# Patient Record
Sex: Male | Born: 1970
Health system: Southern US, Community
[De-identification: ages and names within clinical notes are randomized; demographics above are authoritative.]

## PROBLEM LIST (undated history)

## (undated) DIAGNOSIS — I10 Essential (primary) hypertension: Secondary | ICD-10-CM

## (undated) DIAGNOSIS — F419 Anxiety disorder, unspecified: Secondary | ICD-10-CM

## (undated) DIAGNOSIS — K589 Irritable bowel syndrome without diarrhea: Secondary | ICD-10-CM

## (undated) DIAGNOSIS — K200 Eosinophilic esophagitis: Secondary | ICD-10-CM

## (undated) DIAGNOSIS — K222 Esophageal obstruction: Secondary | ICD-10-CM

## (undated) DIAGNOSIS — K219 Gastro-esophageal reflux disease without esophagitis: Secondary | ICD-10-CM

## (undated) DIAGNOSIS — G43909 Migraine, unspecified, not intractable, without status migrainosus: Secondary | ICD-10-CM

## (undated) DIAGNOSIS — I4891 Unspecified atrial fibrillation: Secondary | ICD-10-CM

## (undated) DIAGNOSIS — T7840XA Allergy, unspecified, initial encounter: Secondary | ICD-10-CM

## (undated) HISTORY — PX: ARTHROSCOPIC REPAIR ACL: SUR80

## (undated) HISTORY — DX: Allergy, unspecified, initial encounter: T78.40XA

## (undated) HISTORY — DX: Anxiety disorder, unspecified: F41.9

## (undated) HISTORY — PX: MYRINGOTOMY: SUR874

## (undated) HISTORY — DX: Eosinophilic esophagitis: K20.0

## (undated) HISTORY — DX: Unspecified atrial fibrillation: I48.91

## (undated) HISTORY — PX: OTHER SURGICAL HISTORY: SHX169

## (undated) HISTORY — DX: Gastro-esophageal reflux disease without esophagitis: K21.9

## (undated) HISTORY — DX: Migraine, unspecified, not intractable, without status migrainosus: G43.909

## (undated) HISTORY — PX: ADENOIDECTOMY: SUR15

## (undated) HISTORY — DX: Irritable bowel syndrome, unspecified: K58.9

## (undated) HISTORY — DX: Esophageal obstruction: K22.2

---

## 2005-04-18 ENCOUNTER — Encounter: Admission: RE | Admit: 2005-04-18 | Discharge: 2005-04-18 | Payer: Self-pay | Admitting: Family Medicine

## 2005-05-09 ENCOUNTER — Ambulatory Visit (HOSPITAL_COMMUNITY): Admission: RE | Admit: 2005-05-09 | Discharge: 2005-05-09 | Payer: Self-pay | Admitting: Neurology

## 2005-07-05 ENCOUNTER — Ambulatory Visit: Payer: Self-pay

## 2010-11-23 ENCOUNTER — Other Ambulatory Visit: Payer: Self-pay | Admitting: Family Medicine

## 2010-11-23 DIAGNOSIS — R202 Paresthesia of skin: Secondary | ICD-10-CM

## 2010-12-02 ENCOUNTER — Other Ambulatory Visit: Payer: Self-pay

## 2010-12-03 ENCOUNTER — Ambulatory Visit
Admission: RE | Admit: 2010-12-03 | Discharge: 2010-12-03 | Disposition: A | Discharge status: Home / Self Care | Payer: BC Managed Care – PPO | Source: Ambulatory Visit | Attending: Family Medicine | Admitting: Family Medicine

## 2010-12-03 DIAGNOSIS — R202 Paresthesia of skin: Secondary | ICD-10-CM

## 2010-12-03 MED ORDER — GADOBENATE DIMEGLUMINE 529 MG/ML IV SOLN
15.0000 mL | Freq: Once | INTRAVENOUS | Status: AC | PRN
  Administered 2010-12-03: 15 mL via INTRAVENOUS

## 2011-04-23 ENCOUNTER — Emergency Department (HOSPITAL_COMMUNITY): Payer: BC Managed Care – PPO

## 2011-04-23 ENCOUNTER — Emergency Department (HOSPITAL_COMMUNITY)
Admission: EM | Admit: 2011-04-23 | Discharge: 2011-04-23 | Disposition: A | Discharge status: Home / Self Care | Payer: BC Managed Care – PPO | Attending: Emergency Medicine | Admitting: Emergency Medicine

## 2011-04-23 ENCOUNTER — Encounter (HOSPITAL_COMMUNITY): Payer: Self-pay | Admitting: Emergency Medicine

## 2011-04-23 ENCOUNTER — Other Ambulatory Visit: Payer: Self-pay

## 2011-04-23 DIAGNOSIS — I498 Other specified cardiac arrhythmias: Secondary | ICD-10-CM | POA: Insufficient documentation

## 2011-04-23 DIAGNOSIS — R0602 Shortness of breath: Secondary | ICD-10-CM | POA: Insufficient documentation

## 2011-04-23 DIAGNOSIS — R079 Chest pain, unspecified: Secondary | ICD-10-CM | POA: Insufficient documentation

## 2011-04-23 DIAGNOSIS — R0789 Other chest pain: Secondary | ICD-10-CM | POA: Insufficient documentation

## 2011-04-23 DIAGNOSIS — I10 Essential (primary) hypertension: Secondary | ICD-10-CM | POA: Insufficient documentation

## 2011-04-23 DIAGNOSIS — R002 Palpitations: Secondary | ICD-10-CM | POA: Insufficient documentation

## 2011-04-23 HISTORY — DX: Essential (primary) hypertension: I10

## 2011-04-23 LAB — CBC
HCT: 46.8 % (ref 39.0–52.0)
Hemoglobin: 17 g/dL (ref 13.0–17.0)
MCHC: 36.3 g/dL — ABNORMAL HIGH (ref 30.0–36.0)
Platelets: 283 10*3/uL (ref 150–400)
RBC: 5.24 MIL/uL (ref 4.22–5.81)

## 2011-04-23 LAB — DIFFERENTIAL
Basophils Relative: 0 % (ref 0–1)
Eosinophils Absolute: 0.1 10*3/uL (ref 0.0–0.7)
Eosinophils Relative: 1 % (ref 0–5)
Lymphocytes Relative: 34 % (ref 12–46)
Neutro Abs: 5.7 10*3/uL (ref 1.7–7.7)

## 2011-04-23 LAB — BASIC METABOLIC PANEL
CO2: 24 mEq/L (ref 19–32)
Chloride: 99 mEq/L (ref 96–112)
Creatinine, Ser: 0.94 mg/dL (ref 0.50–1.35)
GFR calc non Af Amer: >90 mL/min (ref >90)
Glucose, Bld: 112 mg/dL — ABNORMAL HIGH (ref 70–99)

## 2011-04-23 LAB — POCT I-STAT TROPONIN I

## 2011-04-23 LAB — D-DIMER, QUANTITATIVE: D-Dimer, Quant: <0.22 ug/mL-FEU (ref 0.00–0.48)

## 2011-04-23 MED ORDER — LORAZEPAM 2 MG/ML IJ SOLN
0.5000 mg | Freq: Once | INTRAMUSCULAR | Status: AC
  Administered 2011-04-23: 0.5 mg via INTRAVENOUS
  Filled 2011-04-23: qty 1

## 2011-04-23 MED ORDER — SODIUM CHLORIDE 0.9 % IV BOLUS (SEPSIS)
1000.0000 mL | Freq: Once | INTRAVENOUS | Status: AC
  Administered 2011-04-23: 1000 mL via INTRAVENOUS

## 2011-04-23 MED ORDER — ALPRAZOLAM 0.5 MG PO TABS
0.5000 mg | ORAL_TABLET | Freq: Every evening | ORAL | Status: AC | PRN
Start: 2011-04-23 — End: 2011-05-23

## 2011-04-23 NOTE — ED Notes (Signed)
Pt reports sudden onset of weakness, set down, became pale, sob w/talking, and chest "fluttering" approx 1100 this am, Pt ran 2 miles and a 10 minute sprint workout approx 0800-0830 this am. Pt dx w/HTN and started on Losartan 25 mg BID in February for 3 weeks, pcp then increased Losartan to 50 mg BID. Pt had a similar episode in January. Pt does not have a Cardiologist. Pt denies chest/abd/back pain, N/V/D, dizziness, or diaphoresis. Pt reports URI infection and nasal congestion 3 weeks ago, reports taking Sudafed and Delsym for cough and congestion.

## 2011-04-23 NOTE — ED Provider Notes (Signed)
Medical screening examination/treatment/procedure(s) were conducted as a shared visit with non-physician practitioner(s) and myself.  I personally evaluated the patient during the encounter   Forbes Cellar, MD 04/23/11 2057

## 2011-04-23 NOTE — ED Notes (Signed)
Medical screening examination/treatment/procedure(s) were conducted as a shared visit with non-physician practitioner(s) and myself.  I personally evaluated the patient during the encounter   Forbes Cellar, MD 04/23/11 [phone]

## 2011-04-23 NOTE — ED Notes (Signed)
Pt. Stated, i started having some irregular heartbeat about 45 minutes ago.  When i start talking i get out of beath

## 2011-04-23 NOTE — ED Provider Notes (Signed)
Patient had two negative troponins and repeat EKG is unchanged.  VSS.  Pt d/c'd home w/ short course of xanax for anxiety, as recommended by Dr. Hyman Hopes.  He will follow up with this PCP as well as [facility].  He has been advised to avoid exertional activities until evaluated by the cardiologist.  Return precautions discussed.   Otilio Miu, [place] 04/23/11 [phone]

## 2011-04-23 NOTE — ED Provider Notes (Signed)
History     CSN: [id number]  Arrival date & time 04/23/11  1148   First MD Initiated Contact with Patient 04/23/11 1218     HPI Patient reports he is playing outside with his children this afternoon. Dates when he went back inside his heart suddenly began to flutter and beat her regularly. States this has been constant since it began. Associated with some mild shortness of breath that occurs when he is talking. Denies any pain. Denies particularly any anxiety, back pain. Reports significant history of hypertension and  family history of his father having a cardiomyopathy but not ACS. Patient is a 41 y.o. male presenting with palpitations. The history is provided by the patient and the spouse.  Palpitations  This is a new problem. The current episode started less than 1 hour ago. Episode frequency: Every few minutes. The problem has been gradually improving. The problem is associated with an unknown factor. Associated symptoms include shortness of breath. Pertinent negatives include no fever, no malaise/fatigue, no numbness, no chest pain, no syncope, no abdominal pain, no nausea, no vomiting, no headaches, no back pain, no leg pain, no dizziness and no weakness. He has tried breathing exercises for the symptoms. The treatment provided mild relief. Risk factors include being male.    Past Medical History  Diagnosis Date  . Hypertension     History reviewed. No pertinent past surgical history.  No family history on file.  History  Substance Use Topics  . Smoking status: Not on file  . Smokeless tobacco: Not on file  . Alcohol Use: 1.2 oz/week    2 Glasses of wine per week      Review of Systems  Constitutional: Negative for fever, chills and malaise/fatigue.  HENT: Negative for sore throat, mouth sores, neck pain and sinus pressure.   Respiratory: Positive for shortness of breath.   Cardiovascular: Positive for palpitations. Negative for chest pain and syncope.  Gastrointestinal:  Negative for nausea, vomiting and abdominal pain.  Musculoskeletal: Negative for back pain.  Neurological: Negative for dizziness, syncope, speech difficulty, weakness, light-headedness, numbness and headaches.  All other systems reviewed and are negative.    Allergies  Penicillins  Home Medications   Current Outpatient Rx  Name Route Sig Dispense Refill  . VITAMIN C PO Oral Take 1 tablet by mouth daily.    Marland Kitchen LOSARTAN POTASSIUM 25 MG PO TABS Oral Take 25 mg by mouth 2 (two) times daily.    . ADULT MULTIVITAMIN W/MINERALS CH Oral Take 1 tablet by mouth daily.    Marland Kitchen RANITIDINE HCL 150 MG PO TABS Oral Take 150 mg by mouth daily.      BP 139/82  Pulse 127  Temp(Src) 98.1 F (36.7 C) (Oral)  Resp 20  SpO2 99%  Physical Exam  Constitutional: He is oriented to person, place, and time. He appears well-developed and well-nourished.  HENT:  Head: Normocephalic and atraumatic.  Eyes: Conjunctivae are normal. Pupils are equal, round, and reactive to light.  Neck: Normal range of motion. Neck supple.  Cardiovascular: Regular rhythm, normal heart sounds and intact distal pulses.  Tachycardia present.  Exam reveals no gallop and no friction rub.   No murmur heard. Pulmonary/Chest: Effort normal and breath sounds normal. He has no wheezes. He has no rales. He exhibits no tenderness.  Abdominal: Soft. Bowel sounds are normal. He exhibits no distension and no mass. There is no tenderness. There is no rebound and no guarding.  Neurological: He is alert and oriented  to person, place, and time.  Skin: Skin is warm and dry. No rash noted. No erythema. No pallor.  Psychiatric: He has a normal mood and affect. His behavior is normal.    ED Course  Procedures  Results for orders placed during the hospital encounter of 04/23/11  CBC      Component Value Range   WBC 9.8  4.0 - 10.5 (K/uL)   RBC 5.24  4.22 - 5.81 (MIL/uL)   Hemoglobin 17.0  13.0 - 17.0 (g/dL)   HCT 40.9  81.1 - 91.4 (%)   MCV  89.3  78.0 - 100.0 (fL)   MCH 32.4  26.0 - 34.0 (pg)   MCHC 36.3 (*) 30.0 - 36.0 (g/dL)   RDW 78.2  95.6 - 21.3 (%)   Platelets 283  150 - 400 (K/uL)  DIFFERENTIAL      Component Value Range   Neutrophils Relative 58  43 - 77 (%)   Neutro Abs 5.7  1.7 - 7.7 (K/uL)   Lymphocytes Relative 34  12 - 46 (%)   Lymphs Abs 3.3  0.7 - 4.0 (K/uL)   Monocytes Relative 7  3 - 12 (%)   Monocytes Absolute 0.7  0.1 - 1.0 (K/uL)   Eosinophils Relative 1  0 - 5 (%)   Eosinophils Absolute 0.1  0.0 - 0.7 (K/uL)   Basophils Relative 0  0 - 1 (%)   Basophils Absolute 0.0  0.0 - 0.1 (K/uL)  BASIC METABOLIC PANEL      Component Value Range   Sodium 135  135 - 145 (mEq/L)   Potassium 3.7  3.5 - 5.1 (mEq/L)   Chloride 99  96 - 112 (mEq/L)   CO2 24  19 - 32 (mEq/L)   Glucose, Bld 112 (*) 70 - 99 (mg/dL)   BUN 11  6 - 23 (mg/dL)   Creatinine, Ser 0.86  0.50 - 1.35 (mg/dL)   Calcium 9.4  8.4 - 57.8 (mg/dL)   GFR calc non Af Amer >90  >90 (mL/min)   GFR calc Af Amer >90  >90 (mL/min)  TROPONIN I      Component Value Range   Troponin I <0.30  <0.30 (ng/mL)   Dg Chest 2 View  04/23/2011  *RADIOLOGY REPORT*  Clinical Data: Palpitations.  Chest tightness.  Shortness of breath.  Cough.  Hypertension.  CHEST - 2 VIEW  Comparison: None.  Findings: Midline trachea.  Normal heart size and mediastinal contours. No pleural effusion or pneumothorax.  Clear lungs.  There may be biapical pleural thickening.  IMPRESSION: No acute cardiopulmonary disease.  Original Report Authenticated By: Consuello Bossier, M.D.   ED ECG REPORT   Date: 04/23/2011  EKG Time: 1:55 PM  Rate: 113  Rhythm: sinus tachycardia,  there are no previous tracings available for comparison  Axis: normal  Intervals:none  ST&T Change: none   MDM   1:54 PM  Discussed imaging and labs with patient and family. Patient's heart rate has come down from 133 when I initially entered to 111. Will give another liter of fluids in 0.5 mg of Ativan. Pending  d-dimer and troponin 3 hours. Discussed patient with Betsey Holiday PA-C in the CDU. She will continue care as the patient. Discussed with patient and family the patient followup with cardiologist for possible Holter monitor placement. On room with patient I have seen several skipped beats and PVCs but not at regular intervals. Patient and family voiced understanding. Patient should have another EKG in the  CDU prior to discharge and with heart rate normal.      Thomasene Lot, PA-C 04/23/11 1437

## 2011-04-23 NOTE — ED Provider Notes (Addendum)
Medical screening examination/treatment/procedure(s) were conducted as a shared visit with non-physician practitioner(s) and myself.  I personally evaluated the patient during the encounter  H/o HTN pw Palpitations after exercise with cp or SOB. H/O similar after exercise on a bike. Denies h/o VTE in self or family. No recent hosp/surg/immob. No h/o cancer. Denies exogenous hormone use, no leg pain or swelling. Sinus tachycardia on initial ekg. Does admit to social stressors but doesn't feel "overly anxious". Labs unremarkable including d dimer, trop x 2. After IVF, ativan HR < 100. Asking for anxiolytics for home until he can f/u with PMD and cardiology referral. Repeat EKG   Date: 04/23/2011  Rate: 89  Rhythm: normal sinus rhythm  QRS Axis: normal  Intervals: normal  ST/T Wave abnormalities: normal  Conduction Disutrbances:none  Narrative Interpretation:   Old EKG Reviewed: changes noted rate decreased from previous     Forbes Cellar, MD 04/23/11 1631  Forbes Cellar, MD 04/23/11 2056

## 2011-04-23 NOTE — ED Notes (Signed)
Pt is currently comfortable with no palpitations, heart flutter, and chest pain.  Plan to repeat cardiac markers and ekg.  If normal, plan to f/u with cardiologist.  Pt doesn't currently have a cardiologist.  Will give referral upon discharge.  Due to anxiety, pt would like to have anti-anxiety medication upon discharge.  Advised no exercise until f/u with cardiologist due to pt's family history of cardiomyopathy.  Pt and family voiced understanding.  Jim Gonzalez, [place] 04/23/11 [phone]

## 2011-04-23 NOTE — Discharge Instructions (Signed)
Take xanax as needed for anxiety.  If you need more of this medication, follow up with your primary care doctor.  Follow up with the cardiologist for further evaluation of your chest pain and palpitations.  You should avoid all exertional activities until you have seen the cardiologist.  You may return to the ER if your symptoms worsen or you have any other concerns.

## 2011-04-23 NOTE — ED Notes (Signed)
Report received from Victorino Dike, RN. Pt coming over to cdu to wait for another troponin.

## 2011-04-23 NOTE — ED Notes (Signed)
Lab tech here to draw 1500 troponin level.

## 2011-04-23 NOTE — ED Notes (Signed)
Patient denies pain and is resting comfortably.  

## 2011-04-27 ENCOUNTER — Encounter: Payer: Self-pay | Admitting: Cardiology

## 2011-05-03 ENCOUNTER — Encounter: Payer: Self-pay | Admitting: *Deleted

## 2011-05-03 ENCOUNTER — Encounter: Payer: Self-pay | Admitting: Cardiology

## 2011-05-04 ENCOUNTER — Ambulatory Visit (INDEPENDENT_AMBULATORY_CARE_PROVIDER_SITE_OTHER): Payer: BC Managed Care – PPO | Admitting: Cardiology

## 2011-05-04 ENCOUNTER — Encounter: Payer: Self-pay | Admitting: Cardiology

## 2011-05-04 VITALS — BP 140/90 | HR 92 | Ht 70.0 in | Wt 181.1 lb

## 2011-05-04 DIAGNOSIS — I4891 Unspecified atrial fibrillation: Secondary | ICD-10-CM

## 2011-05-04 DIAGNOSIS — R002 Palpitations: Secondary | ICD-10-CM

## 2011-05-04 DIAGNOSIS — I10 Essential (primary) hypertension: Secondary | ICD-10-CM

## 2011-05-04 MED ORDER — METOPROLOL SUCCINATE ER 25 MG PO TB24: 25.0000 mg | ORAL_TABLET | Freq: Every day | ORAL | Status: DC

## 2011-05-04 NOTE — Assessment & Plan Note (Signed)
Given his atrial fibrillation I will discontinue his Cozaar and instead use Toprol both for his blood pressure and for rate control if his atrial fibrillation recurs.

## 2011-05-04 NOTE — Assessment & Plan Note (Signed)
Patient presents for evaluation of atrial fibrillation. He had a documented episode on April 20 that resolved spontaneously. He also had a probable short episode while exercising in January. An echocardiogram shows normal LV function. Outside TSH reportedly normal. I will discontinue his Cozaar. I will add Toprol 25 mg daily both to control blood pressure and for rate control if his atrial fibrillation recurs. He has been given a prescription for flecainide as needed which he will continue. If he has more frequent episodes in the future then we will need to consider a daily antiarrhythmic. Patient has embolic risk factors of hypertension. Based on recent Chest guidelines, pradaxa would most likely be the best choice for anti-coagulation. I will obtain records from his cardiologist at [facility] concerning their evaluation before making that decision. Continue aspirin for now. Note patient does not need to cardiologist. He will discuss with his wife whether he would like to followup at [facility] or with me. He will contact us with that decision soon.

## 2011-05-04 NOTE — Patient Instructions (Signed)
Your physician wants you to follow-up in: 6 MONTHS WITH DR Jens Som You will receive a reminder letter in the mail two months in advance. If you don't receive a letter, please call our office to schedule the follow-up appointment.   STOP LOSARTAN  START METOPROLOL SUCC 25 MG ONCE DAILY

## 2011-05-04 NOTE — Progress Notes (Signed)
HPI: A pleasant 41 year old male for evaluation of atrial fibrillation. The patient denies dyspnea on exertion, orthopnea, PND, pedal edema, syncope or exertional chest pain. In January while exercising he noticed onset of palpitations described as his heart racing and fluttering. He stopped exercising and his symptoms resolved after 5 minutes. On April 20 the patient was outside playing with his children. He went inside and as he was drinking a cold drink he developed sudden onset of palpitations. No associated chest pain, dyspnea or syncope. He ultimately went to the emergency room. His electrocardiogram revealed atrial fibrillation with a rapid ventricular response. He converted spontaneously to sinus rhythm. Hemoglobin was 17. Potassium was 3.7 with normal renal function. Troponin negative. Chest x-ray showed no acute cardiopulmonary disease. D-dimer was negative. Patient was seen at [facility] for his atrial fibrillation. An echocardiogram showed normal LV function. The right ventricle was borderline dilated. There was mild mitral regurgitation. The patient was given a prescription for flecainide to be used as needed. Apparently he had a TSH checked by his primary care physician which was normal. He has had no problems since. Because of the above he presented for evaluation.  Current Outpatient Prescriptions  Medication Sig Dispense Refill  . ALPRAZolam (XANAX) 0.5 MG tablet Take 1 tablet (0.5 mg total) by mouth at bedtime as needed for sleep.  5 tablet  0  . aspirin 81 MG tablet Take 81 mg by mouth 2 (two) times daily.      . Multiple Vitamin (MULITIVITAMIN WITH MINERALS) TABS Take 1 tablet by mouth daily.      Marland Kitchen omeprazole (PRILOSEC) 20 MG capsule Take 20 mg by mouth daily.      . sertraline (ZOLOFT) 25 MG tablet Take 25 mg by mouth daily.      . metoprolol succinate (TOPROL XL) 25 MG 24 hr tablet Take 1 tablet (25 mg total) by mouth daily.  30 tablet  11    Allergies  Allergen Reactions    . Penicillins Hives    Past Medical History  Diagnosis Date  . Hypertension   . Atrial fibrillation   . Anxiety   . GERD (gastroesophageal reflux disease)     Past Surgical History  Procedure Date  . Left knee surgery     ACL    History   Social History  . Marital Status: Married    Spouse Name: N/A    Number of Children: 2  . Years of Education: N/A   Occupational History  .      [employer]   Social History Main Topics  . Smoking status: Never Smoker   . Smokeless tobacco: Not on file  . Alcohol Use: 1.2 oz/week    2 Glasses of wine per week     Glass of wine every other night  . Drug Use: No  . Sexually Active: Not on file   Other Topics Concern  . Not on file   Social History Narrative  . No narrative on file    Family History  Problem Relation Age of Onset  . Heart disease Father     Cardiomyopathy    ROS: no fevers or chills, productive cough, hemoptysis, dysphasia, odynophagia, melena, hematochezia, dysuria, hematuria, rash, seizure activity, orthopnea, PND, pedal edema, claudication. Remaining systems are negative.  Physical Exam:   Blood pressure 140/90, pulse 92, height 5\' 10"  (1.778 m), weight 82.137 kg (181 lb 1.3 oz).  General:  Well developed/well nourished in NAD Skin warm/dry Patient not depressed No peripheral  clubbing Back-normal HEENT-normal/normal eyelids Neck supple/normal carotid upstroke bilaterally; no bruits; no JVD; no thyromegaly chest - CTA/ normal expansion CV - RRR/normal S1 and S2; no rubs or gallops;  PMI nondisplaced, 1/6 systolic ejection murmur.  Abdomen -NT/ND, no HSM, no mass, + bowel sounds, no bruit 2+ femoral pulses, no bruits Ext-no edema, chords, 2+ DP Neuro-grossly nonfocal  ECG normal sinus rhythm at a rate of 70. No ST changes.

## 2011-05-05 ENCOUNTER — Telehealth: Payer: Self-pay | Admitting: Cardiology

## 2011-05-05 NOTE — Telephone Encounter (Signed)
i have no knowledge of medical facilities in [place] [address]

## 2011-05-05 NOTE — Telephone Encounter (Signed)
Will forward for dr crenshaw review  

## 2011-05-05 NOTE — Telephone Encounter (Signed)
Please return call to patient who was seen 05/03/28. He can be reached at [phone].  Patient questions are:   Patient planning trip to [place], and would like to know if there is a particular medical facility there available to assist him with his A-Fib in the event it becomes an issue.  If Dr. Jens Som does not think there is adequate medical resources there patient will be switching trip to [place]. Please respond ASAP to allow patient to complete travel plans.  Thank You   Meka

## 2011-05-06 NOTE — Telephone Encounter (Signed)
Spoke with pt, aware of dr Ludwig Clarks remarks.

## 2011-05-12 ENCOUNTER — Telehealth: Payer: Self-pay | Admitting: Physician Assistant

## 2011-05-12 NOTE — Telephone Encounter (Signed)
Patient called re: dullness in chest and shortness of breath. He has a history of a fib and HTN. No underlying COPD or asthma. States he does not experience chest pain with a fib. Has not had an exacerbation. Has noticed this over the past week. Was recently started on Toprol and Zoloft. Upon further description, he states this has occurred for the past week, intermittent, occurring mostly at rest, and chest pain is more epigastric/abdominal. States it feels like prior episodes of "nervousness." Advised that it is likely anxiety related. His symptoms are more consistent with this and he has minimal ischemic risk factors. Advised if it worsens to call the office. Understood and agreed.   Jacqulyn Bath, PA-C 05/12/2011 5:28 PM

## 2011-05-13 ENCOUNTER — Telehealth: Payer: Self-pay | Admitting: Cardiology

## 2011-05-13 MED ORDER — DILTIAZEM HCL ER COATED BEADS 120 MG PO CP24: 120.0000 mg | ORAL_CAPSULE | Freq: Every day | ORAL | Status: DC

## 2011-05-13 NOTE — Telephone Encounter (Signed)
Spoke with pt, he is taking the metoprolol with food and he has been having a pain in the upper part of his stomach and then occ nausea. This has been going on since starting the med. He has not taken it today and has had no symptoms. He would like to try something different if possible. Will forward for dr Jens Som review

## 2011-05-13 NOTE — Telephone Encounter (Signed)
Dc toprol; cardizem CD 120 mg po daily Olga Millers

## 2011-05-13 NOTE — Telephone Encounter (Signed)
New msg Pt said he started metoprolol and he has been having nausea. He wanted to talk to you

## 2011-05-13 NOTE — Telephone Encounter (Signed)
Spoke with pt, Aware of dr crenshaw's recommendations.  °

## 2011-05-17 ENCOUNTER — Institutional Professional Consult (permissible substitution): Payer: BC Managed Care – PPO | Admitting: Cardiology

## 2011-05-24 ENCOUNTER — Telehealth: Payer: Self-pay | Admitting: Cardiology

## 2011-05-24 NOTE — Telephone Encounter (Signed)
New msg Pt said he started on diltiazem and he said he feels its not working. He said this is second med he has tried. The first was metoprolol. He maybe wants to go back to metoprolol.

## 2011-05-24 NOTE — Telephone Encounter (Signed)
Spoke with pt, he has been on diltiazem 120 mg for about 10-12 days now. He has noticed his bp is not coming down as good as with metoprolol. His bp is running 130/90ish. He is having no palpitations. He thinks all of his previous issues were caused by prilosec. He has stopped the prilosce and has had no further issues. He wants to try the metoprolol again because it brought his bp down better. He will try 12.5 mg and see if he is able to tolerate. If not he will call me back so the diltiazem dose can be adjusted.

## 2011-06-22 ENCOUNTER — Telehealth: Payer: Self-pay | Admitting: Cardiology

## 2011-06-22 NOTE — Telephone Encounter (Signed)
Spoke with pt, records from [facility] reviewed by dr Jens Som, pt called and follow up appt made to discuss atrial fib and anticoagulation.

## 2011-06-22 NOTE — Telephone Encounter (Signed)
Follow up on previous call:  Patient returning call back to [facility]

## 2011-06-29 ENCOUNTER — Encounter: Payer: Self-pay | Admitting: Cardiology

## 2011-06-29 ENCOUNTER — Ambulatory Visit (INDEPENDENT_AMBULATORY_CARE_PROVIDER_SITE_OTHER): Payer: BC Managed Care – PPO | Admitting: Cardiology

## 2011-06-29 VITALS — BP 142/92 | HR 82 | Ht 70.0 in | Wt 187.1 lb

## 2011-06-29 DIAGNOSIS — I10 Essential (primary) hypertension: Secondary | ICD-10-CM

## 2011-06-29 DIAGNOSIS — I4891 Unspecified atrial fibrillation: Secondary | ICD-10-CM

## 2011-06-29 MED ORDER — DILTIAZEM HCL ER COATED BEADS 240 MG PO CP24: 240.0000 mg | ORAL_CAPSULE | Freq: Every day | ORAL | Status: DC

## 2011-06-29 NOTE — Patient Instructions (Addendum)
Your physician wants you to follow-up in: 6 MONTHS WITH DR Jens Som IN [place] You will receive a reminder letter in the mail two months in advance. If you don't receive a letter, please call our office to schedule the follow-up appointment.   INCREASE DILTIAZEM TO 240 MG ONCE DAILY

## 2011-06-29 NOTE — Progress Notes (Signed)
   HPI: Pleasant male for fu of atrial fibrillation. Patient had probable atrial fibrillation in January of 2013 for approximately 5 minutes. Recurrent episode in April 2013 requiring visit to ER. Hemoglobin was 17. Potassium was 3.7 with normal renal function. Troponin negative. Chest x-ray showed no acute cardiopulmonary disease. D-dimer was negative. Patient was seen at [facility] for his atrial fibrillation. An echocardiogram showed normal LV function. The right ventricle was borderline dilated. There was mild mitral regurgitation. The patient was given a prescription for flecainide to be used as needed. Apparently he had a TSH checked by his primary care physician which was normal. I last saw him in May of 2013. We changed his cozaar to toprol. Since then, he occasionally feels a sensation of dyspnea but does not have dyspnea with running. There is no orthopnea, PND, pedal edema, syncope or exertional chest pain. He's had no recurrent bouts of atrial fibrillation.    Current Outpatient Prescriptions  Medication Sig Dispense Refill  . ALPRAZolam (XANAX) 0.5 MG tablet Take 0.5 mg by mouth as needed.       Marland Kitchen aspirin 81 MG tablet Take 81 mg by mouth 2 (two) times daily.      Marland Kitchen diltiazem (CARDIZEM CD) 120 MG 24 hr capsule Take 1 capsule (120 mg total) by mouth daily.  30 capsule  12  . Multiple Vitamin (MULITIVITAMIN WITH MINERALS) TABS Take 1 tablet by mouth daily.         Past Medical History  Diagnosis Date  . Hypertension   . Atrial fibrillation   . Anxiety   . GERD (gastroesophageal reflux disease)     Past Surgical History  Procedure Date  . Left knee surgery     ACL    History   Social History  . Marital Status: Married    Spouse Name: N/A    Number of Children: 2  . Years of Education: N/A   Occupational History  .      [employer]   Social History Main Topics  . Smoking status: Never Smoker   . Smokeless tobacco: Not on file  . Alcohol Use: 1.2 oz/week    2 Glasses  of wine per week     Glass of wine every other night  . Drug Use: No  . Sexually Active: Not on file   Other Topics Concern  . Not on file   Social History Narrative  . No narrative on file    ROS: no fevers or chills, productive cough, hemoptysis, dysphasia, odynophagia, melena, hematochezia, dysuria, hematuria, rash, seizure activity, orthopnea, PND, pedal edema, claudication. Remaining systems are negative.  Physical Exam: Well-developed well-nourished in no acute distress.  Skin is warm and dry.  HEENT is normal.  Neck is supple.  Chest is clear to auscultation with normal expansion.  Cardiovascular exam is regular rate and rhythm.  Abdominal exam nontender or distended. No masses palpated. Extremities show no edema. neuro grossly intact  ECG sinus rhythm at a rate of 82. No ST changes.

## 2011-06-29 NOTE — Assessment & Plan Note (Signed)
Patient remains in sinus rhythm. He did not tolerate Toprol as it caused increased dyspnea. We will continue with Cardizem. I had a long discussion today concerning anti-coagulation. His CHADS score is 1 for hypertension. Previous recommendation would have been either aspirin or Coumadin. However based on the most recent CHEST guidelines a new agent is recommended such as pradaxa over aspirin. I discussed this with him today and he would prefer not to be on a stronger anticoagulant. He prefers to continue with aspirin. He is concerned about the risk of bleeding. He understands the mildly elevated risk of CVA with aspirin compared to the newer agents. If he has more frequent episodes of atrial fibrillation in the future we will consider adding a daily antiarrhythmic. For now he will continue with flecainide when necessary.

## 2011-06-29 NOTE — Assessment & Plan Note (Signed)
Blood pressure elevated. Increase Cardizem to 240 mg by mouth daily.

## 2011-08-15 ENCOUNTER — Other Ambulatory Visit: Payer: Self-pay | Admitting: Cardiology

## 2011-08-15 DIAGNOSIS — I10 Essential (primary) hypertension: Secondary | ICD-10-CM

## 2011-08-15 DIAGNOSIS — I4891 Unspecified atrial fibrillation: Secondary | ICD-10-CM

## 2011-08-15 MED ORDER — DILTIAZEM HCL ER COATED BEADS 120 MG PO CP24: 120.0000 mg | ORAL_CAPSULE | Freq: Every day | ORAL | Status: DC

## 2011-08-15 NOTE — Telephone Encounter (Signed)
New msg Pt is taking diltiazem. He has some sob since taking this med. Please call

## 2011-08-15 NOTE — Telephone Encounter (Signed)
Pt calling stating he feels he is experiencing a feeling like he "cannot catch my breath" since increasing dose of cardizem to 240mg  --states this has happened before with metoprolol and with increased dose of cardiazem --advised to go back to 120mg  cardiazem qd--try for 1 week--we will call you back to see how you are doing-- or if feeling of dyspnea comes back sooner , please call us back or go to nearest ED--[facility]--NT

## 2011-08-24 ENCOUNTER — Telehealth: Payer: Self-pay | Admitting: Cardiology

## 2011-08-24 NOTE — Telephone Encounter (Signed)
Fu call °Pt returning your call  °

## 2011-08-24 NOTE — Telephone Encounter (Signed)
Spoke with pt, he had some trouble with the cardizem 240 mg making him feel it is hard for him to get a deep breath. Since the decrease back to the 120 mg he wakes in the morning fine and then he reports that during the day he will get the feeling that he needs to take a deep breath and can not get a good one. It comes and goes at different times of the day. He does not have trouble sleeping. His bp at his wellness check at work last Friday was 118/60. He has a bp cuff and will cont to monitor his bp. His symptoms are better on the decreased dosage of cardizem but he is not at 100 %. Will forward for dr Jens Som review

## 2011-08-25 NOTE — Telephone Encounter (Signed)
Left message for pt of dr crenshaw's recommendations  

## 2011-08-25 NOTE — Telephone Encounter (Signed)
Continue cardizem at 120 mg daily Jim Gonzalez

## 2011-09-02 ENCOUNTER — Telehealth: Payer: Self-pay | Admitting: Cardiology

## 2011-09-02 NOTE — Telephone Encounter (Signed)
DC cardizem Jim Gonzalez

## 2011-09-02 NOTE — Telephone Encounter (Signed)
Pt has a question regarding his medication °

## 2011-09-02 NOTE — Telephone Encounter (Signed)
Patient was taken Cardizem 240 mg, dose was decrease to 120 mg due to C/O of SOB. Patient states has been taken  120 mg Cardizem for 3 weeks , and  he still has  SOB, its not bad, but he feels like he does not get enough air in his lungs, and can't take deep breaths. Also pt c/o of pain on center of back,  he think it could be  muscle pain. Patient would like to know if it would be okay to stop this medication completely, or he needs to see his PCP to see what else is going on with him.

## 2011-09-06 NOTE — Telephone Encounter (Signed)
Spoke with pt, Aware of dr crenshaw's recommendations.  °

## 2011-10-13 ENCOUNTER — Other Ambulatory Visit: Payer: Self-pay | Admitting: Cardiology

## 2011-10-13 ENCOUNTER — Other Ambulatory Visit: Payer: Self-pay | Admitting: *Deleted

## 2011-10-13 MED ORDER — DILTIAZEM HCL ER COATED BEADS 240 MG PO CP24: 240.0000 mg | ORAL_CAPSULE | Freq: Every day | ORAL | Status: DC

## 2012-01-10 DIAGNOSIS — K219 Gastro-esophageal reflux disease without esophagitis: Secondary | ICD-10-CM | POA: Insufficient documentation

## 2012-01-10 DIAGNOSIS — F419 Anxiety disorder, unspecified: Secondary | ICD-10-CM | POA: Insufficient documentation

## 2012-01-11 ENCOUNTER — Encounter: Payer: Self-pay | Admitting: Cardiology

## 2012-01-11 ENCOUNTER — Ambulatory Visit (INDEPENDENT_AMBULATORY_CARE_PROVIDER_SITE_OTHER): Payer: BC Managed Care – PPO | Admitting: Cardiology

## 2012-01-11 VITALS — BP 142/91 | HR 59 | Wt 185.0 lb

## 2012-01-11 DIAGNOSIS — K219 Gastro-esophageal reflux disease without esophagitis: Secondary | ICD-10-CM

## 2012-01-11 DIAGNOSIS — I10 Essential (primary) hypertension: Secondary | ICD-10-CM

## 2012-01-11 DIAGNOSIS — I4891 Unspecified atrial fibrillation: Secondary | ICD-10-CM

## 2012-01-11 NOTE — Assessment & Plan Note (Signed)
Continue Cardizem. 

## 2012-01-11 NOTE — Progress Notes (Signed)
   HPI: Pleasant male for fu of atrial fibrillation. Patient had probable atrial fibrillation in January of 2013 for approximately 5 minutes. Recurrent episode in April 2013 requiring visit to ER. Hemoglobin was 17. Potassium was 3.7 with normal renal function. Troponin negative. Chest x-ray showed no acute cardiopulmonary disease. D-dimer was negative. Patient was seen at [facility] for his atrial fibrillation. An echocardiogram showed normal LV function. The right ventricle was borderline dilated. There was mild mitral regurgitation. The patient was given a prescription for flecainide to be used as needed. Apparently he had a TSH checked by his primary care physician which was normal. Did not tolerate toprol previously; preferred ASA to NOAC. I last saw him in June of 2013. Since then, there is no dyspnea on exertion, orthopnea, PND, pedal edema, syncope or exertional chest pain. He has brief palpitations twice since I saw him previously but no atrial fibrillation.  Current Outpatient Prescriptions  Medication Sig Dispense Refill  . ALPRAZolam (XANAX) 0.5 MG tablet Take 0.5 mg by mouth as needed.       Marland Kitchen aspirin 81 MG tablet Take 81 mg by mouth 2 (two) times daily.      Marland Kitchen diltiazem (CARDIZEM CD) 240 MG 24 hr capsule Take 1 capsule (240 mg total) by mouth daily.  30 capsule  12  . Multiple Vitamin (MULITIVITAMIN WITH MINERALS) TABS Take 1 tablet by mouth daily.         Past Medical History  Diagnosis Date  . Hypertension   . Atrial fibrillation   . Anxiety   . GERD (gastroesophageal reflux disease)     Past Surgical History  Procedure Date  . Left knee surgery     ACL    History   Social History  . Marital Status: Married    Spouse Name: N/A    Number of Children: 2  . Years of Education: N/A   Occupational History  .      [employer]   Social History Main Topics  . Smoking status: Never Smoker   . Smokeless tobacco: Not on file  . Alcohol Use: 1.2 oz/week    2 Glasses of  wine per week     Comment: Glass of wine every other night  . Drug Use: No  . Sexually Active: Not on file   Other Topics Concern  . Not on file   Social History Narrative  . No narrative on file    ROS: no fevers or chills, productive cough, hemoptysis, dysphasia, odynophagia, melena, hematochezia, dysuria, hematuria, rash, seizure activity, orthopnea, PND, pedal edema, claudication. Remaining systems are negative.  Physical Exam: Well-developed well-nourished in no acute distress.  Skin is warm and dry.  HEENT is normal.  Neck is supple.  Chest is clear to auscultation with normal expansion.  Cardiovascular exam is regular rate and rhythm.  Abdominal exam nontender or distended. No masses palpated. Extremities show no edema. neuro grossly intact  ECG sinus rhythm at a rate of 59. No ST changes.

## 2012-01-11 NOTE — Assessment & Plan Note (Signed)
Management per primary care. 

## 2012-01-11 NOTE — Assessment & Plan Note (Addendum)
Patient remains in sinus rhythm. Continue Cardizem for rate control if his atrial fibrillation recurs. He previously declined anticoagulation. His Chads score is 1. Continue aspirin. If he has more frequent episodes in the future we will consider antiarrhythmic versus ablation.

## 2012-01-11 NOTE — Patient Instructions (Addendum)
Your physician wants you to follow-up in: ONE YEAR WITH DR CRENSHAW IN [place] You will receive a reminder letter in the mail two months in advance. If you don't receive a letter, please call our office to schedule the follow-up appointment.  

## 2012-01-23 NOTE — Addendum Note (Signed)
Addended by: Reine Just on: 01/23/2012 03:18 PM   Modules accepted: Orders

## 2012-05-14 ENCOUNTER — Other Ambulatory Visit: Payer: Self-pay | Admitting: Gastroenterology

## 2012-05-14 DIAGNOSIS — R109 Unspecified abdominal pain: Secondary | ICD-10-CM

## 2012-05-17 ENCOUNTER — Ambulatory Visit
Admission: RE | Admit: 2012-05-17 | Discharge: 2012-05-17 | Disposition: A | Discharge status: Home / Self Care | Payer: BC Managed Care – PPO | Source: Ambulatory Visit | Attending: Gastroenterology | Admitting: Gastroenterology

## 2012-05-17 DIAGNOSIS — R109 Unspecified abdominal pain: Secondary | ICD-10-CM

## 2012-05-31 ENCOUNTER — Other Ambulatory Visit: Payer: Self-pay | Admitting: Gastroenterology

## 2012-05-31 DIAGNOSIS — R109 Unspecified abdominal pain: Secondary | ICD-10-CM

## 2012-06-01 ENCOUNTER — Ambulatory Visit
Admission: RE | Admit: 2012-06-01 | Discharge: 2012-06-01 | Disposition: A | Discharge status: Home / Self Care | Payer: BC Managed Care – PPO | Source: Ambulatory Visit | Attending: Gastroenterology | Admitting: Gastroenterology

## 2012-06-01 DIAGNOSIS — R109 Unspecified abdominal pain: Secondary | ICD-10-CM

## 2012-06-01 MED ORDER — IOHEXOL 300 MG/ML  SOLN
100.0000 mL | Freq: Once | INTRAMUSCULAR | Status: AC | PRN
  Administered 2012-06-01: 100 mL via INTRAVENOUS

## 2012-06-05 ENCOUNTER — Other Ambulatory Visit: Payer: Self-pay | Admitting: Cardiology

## 2012-06-05 MED ORDER — DILTIAZEM HCL ER COATED BEADS 240 MG PO CP24: 240.0000 mg | ORAL_CAPSULE | Freq: Every day | ORAL | Status: DC

## 2012-06-07 ENCOUNTER — Other Ambulatory Visit: Payer: Self-pay | Admitting: Cardiology

## 2013-01-16 ENCOUNTER — Encounter: Payer: Self-pay | Admitting: Cardiology

## 2013-01-16 ENCOUNTER — Ambulatory Visit (INDEPENDENT_AMBULATORY_CARE_PROVIDER_SITE_OTHER): Payer: BC Managed Care – PPO | Admitting: Cardiology

## 2013-01-16 VITALS — BP 140/86 | HR 62 | Ht 70.0 in | Wt 183.0 lb

## 2013-01-16 DIAGNOSIS — I10 Essential (primary) hypertension: Secondary | ICD-10-CM

## 2013-01-16 DIAGNOSIS — I4891 Unspecified atrial fibrillation: Secondary | ICD-10-CM

## 2013-01-16 NOTE — Assessment & Plan Note (Signed)
Patient remains in sinus rhythm. Continue Cardizem for rate control if atrial fibrillation recurs. Continue aspirin. His only embolic risk factor is hypertension. Previously declined Coumadin. If he has more frequent episodes in the future we will consider an antiarrhythmic such as flecainide. 

## 2013-01-16 NOTE — Assessment & Plan Note (Signed)
Continue present medications for blood pressure control. He states his systolic blood pressure typically is 130 at home.

## 2013-01-16 NOTE — Patient Instructions (Signed)
Your physician wants you to follow-up in: ONE YEAR WITH DR CRENSHAW You will receive a reminder letter in the mail two months in advance. If you don't receive a letter, please call our office to schedule the follow-up appointment.  

## 2013-01-16 NOTE — Progress Notes (Signed)
      HPI: FU atrial fibrillation. Patient had probable atrial fibrillation in January of 2013 for approximately 5 minutes. Recurrent episode in April 2013 requiring visit to ER. Hemoglobin was 17. Potassium was 3.7 with normal renal function. Troponin negative. Chest x-ray showed no acute cardiopulmonary disease. D-dimer was negative. Patient was seen at [facility] for his atrial fibrillation. An echocardiogram showed normal LV function. The right ventricle was borderline dilated. There was mild mitral regurgitation. The patient was given a prescription for flecainide to be used as needed. Apparently he had a TSH checked by his primary care physician which was normal. Did not tolerate toprol previously; preferred ASA to NOAC. I last saw him in Jan 2014. Since then, there is no dyspnea on exertion, orthopnea, PND, pedal edema, chest pain or syncope. He had 2 brief episodes of palpitations right as he was beginning aerobic exercise that lasted 20 minutes each. Otherwise no atrial fibrillation.   Current Outpatient Prescriptions  Medication Sig Dispense Refill  . aspirin 81 MG tablet Take 81 mg by mouth 2 (two) times daily.      Marland Kitchen diltiazem (CARDIZEM CD) 120 MG 24 hr capsule TAKE 1 CAPSULE (120 MG TOTAL) BY MOUTH DAILY.  30 capsule  9  . Multiple Vitamin (MULITIVITAMIN WITH MINERALS) TABS Take 1 tablet by mouth daily.       No current facility-administered medications for this visit.     Past Medical History  Diagnosis Date  . Hypertension   . Atrial fibrillation   . Anxiety   . GERD (gastroesophageal reflux disease)     Past Surgical History  Procedure Laterality Date  . Left knee surgery      ACL    History   Social History  . Marital Status: Married    Spouse Name: N/A    Number of Children: 2  . Years of Education: N/A   Occupational History  .      [employer]   Social History Main Topics  . Smoking status: Never Smoker   . Smokeless tobacco: Not on file  . Alcohol  Use: 1.2 oz/week    2 Glasses of wine per week     Comment: Glass of wine every other night  . Drug Use: No  . Sexual Activity: Not on file   Other Topics Concern  . Not on file   Social History Narrative  . No narrative on file    ROS: no fevers or chills, productive cough, hemoptysis, dysphasia, odynophagia, melena, hematochezia, dysuria, hematuria, rash, seizure activity, orthopnea, PND, pedal edema, claudication. Remaining systems are negative.  Physical Exam: Well-developed well-nourished in no acute distress.  Skin is warm and dry.  HEENT is normal.  Neck is supple.  Chest is clear to auscultation with normal expansion.  Cardiovascular exam is regular rate and rhythm.  Abdominal exam nontender or distended. No masses palpated. Extremities show no edema. neuro grossly intact  ECG sinus bradycardia with no ST changes.

## 2013-04-09 ENCOUNTER — Other Ambulatory Visit: Payer: Self-pay | Admitting: *Deleted

## 2013-04-09 MED ORDER — DILTIAZEM HCL ER COATED BEADS 120 MG PO CP24: ORAL_CAPSULE | ORAL | Status: DC

## 2014-01-27 NOTE — Progress Notes (Signed)
      HPI: FU atrial fibrillation. Patient had probable atrial fibrillation in January of 2013 for approximately 5 minutes. Recurrent episode in April 2013 requiring visit to ER. Patient was seen at [facility] for his atrial fibrillation. An echocardiogram showed normal LV function. The right ventricle was borderline dilated. There was mild mitral regurgitation. The patient was given a prescription for flecainide to be used as needed. Apparently he had a TSH checked by his primary care physician which was normal. Did not tolerate toprol previously; preferred ASA to NOAC. Since I last saw him, the patient denies any dyspnea on exertion, orthopnea, PND, pedal edema, palpitations, syncope or chest pain.   Current Outpatient Prescriptions  Medication Sig Dispense Refill  . aspirin 81 MG tablet Take 81 mg by mouth 2 (two) times daily.    Marland Kitchen diltiazem (CARDIZEM CD) 180 MG 24 hr capsule Take 180 mg by mouth daily.   12  . Multiple Vitamin (MULITIVITAMIN WITH MINERALS) TABS Take 1 tablet by mouth daily.    Marland Kitchen NEXIUM 20 MG capsule Take 20 mg by mouth daily.  0   No current facility-administered medications for this visit.     Past Medical History  Diagnosis Date  . Hypertension   . Atrial fibrillation   . Anxiety   . GERD (gastroesophageal reflux disease)     Past Surgical History  Procedure Laterality Date  . Left knee surgery      ACL    History   Social History  . Marital Status: Married    Spouse Name: N/A    Number of Children: 2  . Years of Education: N/A   Occupational History  .      [employer]   Social History Main Topics  . Smoking status: Never Smoker   . Smokeless tobacco: Not on file  . Alcohol Use: 1.2 oz/week    2 Glasses of wine per week     Comment: Glass of wine every other night  . Drug Use: No  . Sexual Activity: Not on file   Other Topics Concern  . Not on file   Social History Narrative    ROS: no fevers or chills, productive cough, hemoptysis,  dysphasia, odynophagia, melena, hematochezia, dysuria, hematuria, rash, seizure activity, orthopnea, PND, pedal edema, claudication. Remaining systems are negative.  Physical Exam: Well-developed well-nourished in no acute distress.  Skin is warm and dry.  HEENT is normal.  Neck is supple.  Chest is clear to auscultation with normal expansion.  Cardiovascular exam is regular rate and rhythm.  Abdominal exam nontender or distended. No masses palpated. Extremities show no edema. neuro grossly intact  ECG normal sinus rhythm with no ST changes.

## 2014-01-29 ENCOUNTER — Ambulatory Visit (INDEPENDENT_AMBULATORY_CARE_PROVIDER_SITE_OTHER): Payer: BLUE CROSS/BLUE SHIELD | Admitting: Cardiology

## 2014-01-29 ENCOUNTER — Encounter: Payer: Self-pay | Admitting: Cardiology

## 2014-01-29 VITALS — BP 130/90 | HR 68 | Ht 70.0 in | Wt 185.0 lb

## 2014-01-29 DIAGNOSIS — I10 Essential (primary) hypertension: Secondary | ICD-10-CM

## 2014-01-29 DIAGNOSIS — I480 Paroxysmal atrial fibrillation: Secondary | ICD-10-CM

## 2014-01-29 NOTE — Assessment & Plan Note (Signed)
Patient remains in sinus rhythm. Continue Cardizem for rate control if atrial fibrillation recurs. Continue aspirin. His only embolic risk factor is hypertension. Previously declined Coumadin. If he has more frequent episodes in the future we will consider an antiarrhythmic such as flecainide.

## 2014-01-29 NOTE — Patient Instructions (Signed)
Your physician wants you to follow-up in: ONE YEAR WITH DR CRENSHAW You will receive a reminder letter in the mail two months in advance. If you don't receive a letter, please call our office to schedule the follow-up appointment.  

## 2014-01-29 NOTE — Assessment & Plan Note (Signed)
Blood pressure controlled. Continue present medications. 

## 2014-10-02 ENCOUNTER — Other Ambulatory Visit: Payer: Self-pay | Admitting: Otolaryngology

## 2014-10-02 DIAGNOSIS — M542 Cervicalgia: Secondary | ICD-10-CM

## 2014-10-02 DIAGNOSIS — R591 Generalized enlarged lymph nodes: Secondary | ICD-10-CM

## 2014-10-08 ENCOUNTER — Ambulatory Visit
Admission: RE | Admit: 2014-10-08 | Discharge: 2014-10-08 | Disposition: A | Discharge status: Home / Self Care | Payer: BLUE CROSS/BLUE SHIELD | Source: Ambulatory Visit | Attending: Otolaryngology | Admitting: Otolaryngology

## 2014-10-08 DIAGNOSIS — R591 Generalized enlarged lymph nodes: Secondary | ICD-10-CM

## 2014-10-08 DIAGNOSIS — M542 Cervicalgia: Secondary | ICD-10-CM

## 2014-10-08 MED ORDER — IOPAMIDOL (ISOVUE-300) INJECTION 61%
75.0000 mL | Freq: Once | INTRAVENOUS | Status: AC | PRN
  Administered 2014-10-08: 75 mL via INTRAVENOUS

## 2015-03-04 ENCOUNTER — Ambulatory Visit (INDEPENDENT_AMBULATORY_CARE_PROVIDER_SITE_OTHER): Payer: BLUE CROSS/BLUE SHIELD | Admitting: Cardiology

## 2015-03-04 ENCOUNTER — Encounter: Payer: Self-pay | Admitting: Cardiology

## 2015-03-04 VITALS — BP 162/102 | HR 74 | Ht 70.0 in | Wt 180.0 lb

## 2015-03-04 DIAGNOSIS — I10 Essential (primary) hypertension: Secondary | ICD-10-CM | POA: Diagnosis not present

## 2015-03-04 DIAGNOSIS — I4891 Unspecified atrial fibrillation: Secondary | ICD-10-CM | POA: Diagnosis not present

## 2015-03-04 NOTE — Assessment & Plan Note (Signed)
Blood pressure is elevated. I have asked him to follow this at home and we will increase medications as needed. He states it typically is in the 135/80 range.

## 2015-03-04 NOTE — Progress Notes (Signed)
      HPI: FU atrial fibrillation. Patient had probable atrial fibrillation in January of 2013 for approximately 5 minutes. Recurrent episode in April 2013 requiring visit to ER. Patient was seen at [facility] for his atrial fibrillation. An echocardiogram showed normal LV function. The right ventricle was borderline dilated. There was mild mitral regurgitation. The patient was given a prescription for flecainide to be used as needed. Apparently he had a TSH checked by his primary care physician which was normal. Did not tolerate toprol previously; preferred ASA to NOAC. Since I last saw him, the patient denies any dyspnea on exertion, orthopnea, PND, pedal edema, palpitations, syncope or chest pain.   Current Outpatient Prescriptions  Medication Sig Dispense Refill  . aspirin 81 MG tablet Take 81 mg by mouth 2 (two) times daily.    Marland Kitchen diltiazem (CARDIZEM CD) 180 MG 24 hr capsule Take 180 mg by mouth daily.   12  . Multiple Vitamin (MULITIVITAMIN WITH MINERALS) TABS Take 1 tablet by mouth daily.    Marland Kitchen NEXIUM 20 MG capsule Take 20 mg by mouth daily.  0   No current facility-administered medications for this visit.     Past Medical History  Diagnosis Date  . Hypertension   . Atrial fibrillation   . Anxiety   . GERD (gastroesophageal reflux disease)     Past Surgical History  Procedure Laterality Date  . Left knee surgery      ACL    Social History   Social History  . Marital Status: Married    Spouse Name: N/A  . Number of Children: 2  . Years of Education: N/A   Occupational History  .      [employer]   Social History Main Topics  . Smoking status: Never Smoker   . Smokeless tobacco: Not on file  . Alcohol Use: 1.2 oz/week    2 Glasses of wine per week     Comment: Glass of wine every other night  . Drug Use: No  . Sexual Activity: Not on file   Other Topics Concern  . Not on file   Social History Narrative    Family History  Problem Relation Age of Onset  .  Heart disease Father     Cardiomyopathy    ROS: no fevers or chills, productive cough, hemoptysis, dysphasia, odynophagia, melena, hematochezia, dysuria, hematuria, rash, seizure activity, orthopnea, PND, pedal edema, claudication. Remaining systems are negative.  Physical Exam: Well-developed well-nourished in no acute distress.  Skin is warm and dry.  HEENT is normal.  Neck is supple.  Chest is clear to auscultation with normal expansion.  Cardiovascular exam is regular rate and rhythm.  Abdominal exam nontender or distended. No masses palpated. Extremities show no edema. neuro grossly intact  ECG Sinus rhythm at a rate of 72. No ST changes.

## 2015-03-04 NOTE — Assessment & Plan Note (Signed)
Patient remains in sinus rhythm. Continue Cardizem. Embolic risk factor of hypertension. We discussed the risks and benefits of DOACs. I explained that the guidelines would recommend that he be on 1 given CHADSvasc 1. However he declined. He would prefer aspirin and understands the higher risk of stroke. We can consider the addition of an antiarrhythmic such as flecainide in the future if he has frequent recurrences.

## 2015-03-04 NOTE — Patient Instructions (Signed)
Your physician wants you to follow-up in: ONE YEAR WITH DR CRENSHAW You will receive a reminder letter in the mail two months in advance. If you don't receive a letter, please call our office to schedule the follow-up appointment.   If you need a refill on your cardiac medications before your next appointment, please call your pharmacy.  

## 2015-04-07 DIAGNOSIS — R591 Generalized enlarged lymph nodes: Secondary | ICD-10-CM | POA: Diagnosis not present

## 2015-07-22 DIAGNOSIS — G44209 Tension-type headache, unspecified, not intractable: Secondary | ICD-10-CM | POA: Diagnosis not present

## 2015-08-21 DIAGNOSIS — R51 Headache: Secondary | ICD-10-CM | POA: Diagnosis not present

## 2015-08-24 ENCOUNTER — Other Ambulatory Visit: Payer: Self-pay | Admitting: Family Medicine

## 2015-08-24 DIAGNOSIS — R519 Headache, unspecified: Secondary | ICD-10-CM

## 2015-08-24 DIAGNOSIS — R51 Headache: Principal | ICD-10-CM

## 2015-08-27 ENCOUNTER — Ambulatory Visit
Admission: RE | Admit: 2015-08-27 | Discharge: 2015-08-27 | Disposition: A | Discharge status: Home / Self Care | Payer: BLUE CROSS/BLUE SHIELD | Source: Ambulatory Visit | Attending: Family Medicine | Admitting: Family Medicine

## 2015-08-27 DIAGNOSIS — R51 Headache: Secondary | ICD-10-CM | POA: Diagnosis not present

## 2015-08-27 DIAGNOSIS — R519 Headache, unspecified: Secondary | ICD-10-CM

## 2015-11-16 DIAGNOSIS — K222 Esophageal obstruction: Secondary | ICD-10-CM | POA: Diagnosis not present

## 2015-11-16 DIAGNOSIS — R599 Enlarged lymph nodes, unspecified: Secondary | ICD-10-CM | POA: Diagnosis not present

## 2015-11-16 DIAGNOSIS — K219 Gastro-esophageal reflux disease without esophagitis: Secondary | ICD-10-CM | POA: Diagnosis not present

## 2015-12-08 DIAGNOSIS — B351 Tinea unguium: Secondary | ICD-10-CM | POA: Diagnosis not present

## 2015-12-08 DIAGNOSIS — D229 Melanocytic nevi, unspecified: Secondary | ICD-10-CM | POA: Diagnosis not present

## 2016-02-03 DIAGNOSIS — F419 Anxiety disorder, unspecified: Secondary | ICD-10-CM | POA: Diagnosis not present

## 2016-02-03 DIAGNOSIS — R51 Headache: Secondary | ICD-10-CM | POA: Diagnosis not present

## 2016-02-03 DIAGNOSIS — M542 Cervicalgia: Secondary | ICD-10-CM | POA: Diagnosis not present

## 2016-02-03 DIAGNOSIS — G44221 Chronic tension-type headache, intractable: Secondary | ICD-10-CM | POA: Diagnosis not present

## 2016-03-10 NOTE — Progress Notes (Signed)
      HPI: FU atrial fibrillation. Patient had probable atrial fibrillation in January of 2013 for approximately 5 minutes. Recurrent episode in April 2013 requiring visit to ER. Patient was seen at [facility] for his atrial fibrillation. An echocardiogram showed normal LV function. The right ventricle was borderline dilated. There was mild mitral regurgitation. The patient was given a prescription for flecainide to be used as needed. Apparently he had a TSH checked by his primary care physician which was normal. Did not tolerate toprol previously; preferred ASA to NOAC. Since I last saw him, patient denies dyspnea, chest pain or syncope. He had 2 brief episodes of palpitations while watching his children play basketball but were not sustained. Each lasted 5-10 minutes.  Current Outpatient Prescriptions  Medication Sig Dispense Refill  . aspirin 81 MG tablet Take 81 mg by mouth 2 (two) times daily.    Marland Kitchen diltiazem (CARDIZEM CD) 180 MG 24 hr capsule Take 180 mg by mouth daily.   12  . Multiple Vitamin (MULITIVITAMIN WITH MINERALS) TABS Take 1 tablet by mouth daily.    Marland Kitchen NEXIUM 20 MG capsule Take 20 mg by mouth daily.  0   No current facility-administered medications for this visit.      Past Medical History:  Diagnosis Date  . Anxiety   . Atrial fibrillation   . GERD (gastroesophageal reflux disease)   . Hypertension     Past Surgical History:  Procedure Laterality Date  . Left knee surgery     ACL    Social History   Social History  . Marital status: Married    Spouse name: N/A  . Number of children: 2  . Years of education: N/A   Occupational History  .  [employer]   Social History Main Topics  . Smoking status: Never Smoker  . Smokeless tobacco: Never Used  . Alcohol use 1.2 oz/week    2 Glasses of wine per week     Comment: Glass of wine every other night  . Drug use: No  . Sexual activity: Not on file   Other Topics Concern  . Not on file   Social  History Narrative  . No narrative on file    Family History  Problem Relation Age of Onset  . Heart disease Father     Cardiomyopathy    ROS: no fevers or chills, productive cough, hemoptysis, dysphasia, odynophagia, melena, hematochezia, dysuria, hematuria, rash, seizure activity, orthopnea, PND, pedal edema, claudication. Remaining systems are negative.  Physical Exam: Well-developed well-nourished in no acute distress.  Skin is warm and dry.  HEENT is normal.  Neck is supple. No bruits Chest is clear to auscultation with normal expansion.  Cardiovascular exam is regular rate and rhythm.  Abdominal exam nontender or distended. No masses palpated. Extremities show no edema. neuro grossly intact  ECG- sinus rhythm at a rate of 65. No ST changes; personally reviewed  A/P  1 paroxysmal atrial fibrillation-patient remains in sinus rhythm. Continue Cardizem for rate control if atrial fibrillation recurs. CHADSvasc 1. Patient has declined anticoagulation previously but would be willing to consider. Given low CHADSvasc we will continue aspirin. We will consider the addition of an antiarrhythmic in the future if he has more frequent episodes.   2 hypertension-blood pressure elevated. Increase Cardizem to 240 mg daily and follow.     Kirk Ruths, MD

## 2016-03-16 ENCOUNTER — Encounter: Payer: Self-pay | Admitting: Cardiology

## 2016-03-16 ENCOUNTER — Ambulatory Visit (INDEPENDENT_AMBULATORY_CARE_PROVIDER_SITE_OTHER): Payer: BLUE CROSS/BLUE SHIELD | Admitting: Cardiology

## 2016-03-16 VITALS — BP 160/94 | HR 65 | Ht 70.0 in | Wt 190.4 lb

## 2016-03-16 DIAGNOSIS — I4891 Unspecified atrial fibrillation: Secondary | ICD-10-CM

## 2016-03-16 DIAGNOSIS — I10 Essential (primary) hypertension: Secondary | ICD-10-CM

## 2016-03-16 MED ORDER — DILTIAZEM HCL ER COATED BEADS 240 MG PO CP24: 240.0000 mg | ORAL_CAPSULE | Freq: Every day | ORAL | 3 refills | Status: DC

## 2016-03-16 NOTE — Patient Instructions (Signed)
Medication Instructions:   INCREASE DILTIAZEM TO 240 MG ONCE DAILY  Follow-Up:  Your physician wants you to follow-up in: [place] will receive a reminder letter in the mail two months in advance. If you don't receive a letter, please call our office to schedule the follow-up appointment.   If you need a refill on your cardiac medications before your next appointment, please call your pharmacy.

## 2016-05-11 DIAGNOSIS — R590 Localized enlarged lymph nodes: Secondary | ICD-10-CM | POA: Diagnosis not present

## 2016-05-11 DIAGNOSIS — R599 Enlarged lymph nodes, unspecified: Secondary | ICD-10-CM | POA: Diagnosis not present

## 2016-05-11 DIAGNOSIS — K219 Gastro-esophageal reflux disease without esophagitis: Secondary | ICD-10-CM | POA: Diagnosis not present

## 2016-05-17 DIAGNOSIS — Z0000 Encounter for general adult medical examination without abnormal findings: Secondary | ICD-10-CM | POA: Diagnosis not present

## 2016-05-17 DIAGNOSIS — Z13220 Encounter for screening for lipoid disorders: Secondary | ICD-10-CM | POA: Diagnosis not present

## 2016-05-25 DIAGNOSIS — Z0000 Encounter for general adult medical examination without abnormal findings: Secondary | ICD-10-CM | POA: Diagnosis not present

## 2016-05-25 DIAGNOSIS — R8290 Unspecified abnormal findings in urine: Secondary | ICD-10-CM | POA: Diagnosis not present

## 2016-06-06 DIAGNOSIS — D1801 Hemangioma of skin and subcutaneous tissue: Secondary | ICD-10-CM | POA: Diagnosis not present

## 2016-06-06 DIAGNOSIS — D225 Melanocytic nevi of trunk: Secondary | ICD-10-CM | POA: Diagnosis not present

## 2016-06-06 DIAGNOSIS — D2362 Other benign neoplasm of skin of left upper limb, including shoulder: Secondary | ICD-10-CM | POA: Diagnosis not present

## 2016-06-06 DIAGNOSIS — L814 Other melanin hyperpigmentation: Secondary | ICD-10-CM | POA: Diagnosis not present

## 2016-06-06 DIAGNOSIS — D485 Neoplasm of uncertain behavior of skin: Secondary | ICD-10-CM | POA: Diagnosis not present

## 2016-06-06 DIAGNOSIS — Z86018 Personal history of other benign neoplasm: Secondary | ICD-10-CM | POA: Diagnosis not present

## 2016-06-29 DIAGNOSIS — F419 Anxiety disorder, unspecified: Secondary | ICD-10-CM | POA: Diagnosis not present

## 2016-06-29 DIAGNOSIS — K219 Gastro-esophageal reflux disease without esophagitis: Secondary | ICD-10-CM | POA: Diagnosis not present

## 2016-07-13 ENCOUNTER — Encounter (HOSPITAL_COMMUNITY): Payer: Self-pay | Admitting: Emergency Medicine

## 2016-07-13 ENCOUNTER — Ambulatory Visit (HOSPITAL_COMMUNITY): Admission: EM | Admit: 2016-07-13 | Discharge: 2016-07-13 | Payer: BLUE CROSS/BLUE SHIELD | Source: Home / Self Care

## 2016-07-13 ENCOUNTER — Emergency Department (HOSPITAL_COMMUNITY)
Admission: EM | Admit: 2016-07-13 | Discharge: 2016-07-13 | Disposition: A | Discharge status: Home / Self Care | Payer: BLUE CROSS/BLUE SHIELD | Attending: Emergency Medicine | Admitting: Emergency Medicine

## 2016-07-13 DIAGNOSIS — K2100 Gastro-esophageal reflux disease with esophagitis, without bleeding: Secondary | ICD-10-CM

## 2016-07-13 DIAGNOSIS — R1013 Epigastric pain: Secondary | ICD-10-CM | POA: Diagnosis present

## 2016-07-13 DIAGNOSIS — K222 Esophageal obstruction: Secondary | ICD-10-CM | POA: Diagnosis not present

## 2016-07-13 DIAGNOSIS — Z79899 Other long term (current) drug therapy: Secondary | ICD-10-CM | POA: Diagnosis not present

## 2016-07-13 DIAGNOSIS — K219 Gastro-esophageal reflux disease without esophagitis: Secondary | ICD-10-CM | POA: Diagnosis not present

## 2016-07-13 DIAGNOSIS — Z7982 Long term (current) use of aspirin: Secondary | ICD-10-CM | POA: Insufficient documentation

## 2016-07-13 DIAGNOSIS — I10 Essential (primary) hypertension: Secondary | ICD-10-CM | POA: Diagnosis not present

## 2016-07-13 DIAGNOSIS — R1310 Dysphagia, unspecified: Secondary | ICD-10-CM | POA: Diagnosis not present

## 2016-07-13 DIAGNOSIS — R1319 Other dysphagia: Secondary | ICD-10-CM | POA: Diagnosis not present

## 2016-07-13 LAB — BASIC METABOLIC PANEL
Anion gap: 9 (ref 5–15)
BUN: 9 mg/dL (ref 6–20)
CALCIUM: 9.3 mg/dL (ref 8.9–10.3)
CHLORIDE: 105 mmol/L (ref 101–111)
CO2: 25 mmol/L (ref 22–32)
CREATININE: 1.02 mg/dL (ref 0.61–1.24)
GFR calc non Af Amer: >60 mL/min (ref >60)
GLUCOSE: 104 mg/dL — AB (ref 65–99)
Potassium: 3.9 mmol/L (ref 3.5–5.1)
Sodium: 139 mmol/L (ref 135–145)

## 2016-07-13 LAB — CBC
HCT: 48.7 % (ref 39.0–52.0)
Hemoglobin: 17 g/dL (ref 13.0–17.0)
MCH: 31.7 pg (ref 26.0–34.0)
MCHC: 34.9 g/dL (ref 30.0–36.0)
MCV: 90.7 fL (ref 78.0–100.0)
PLATELETS: 263 10*3/uL (ref 150–400)
RBC: 5.37 MIL/uL (ref 4.22–5.81)
RDW: 12.3 % (ref 11.5–15.5)
WBC: 7.3 10*3/uL (ref 4.0–10.5)

## 2016-07-13 LAB — I-STAT TROPONIN, ED: TROPONIN I, POC: 0.01 ng/mL (ref 0.00–0.08)

## 2016-07-13 MED ORDER — LIDOCAINE VISCOUS 2 % MT SOLN: 15.0000 mL | OROMUCOSAL | 0 refills | Status: DC | PRN

## 2016-07-13 MED ORDER — GI COCKTAIL ~~LOC~~
30.0000 mL | Freq: Once | ORAL | Status: AC
  Administered 2016-07-13: 30 mL via ORAL
  Filled 2016-07-13: qty 30

## 2016-07-13 NOTE — ED Notes (Signed)
Pt having pain in chest he describes as a dull, aching, burning heartburn feeling in epigastric region. Pt also having pain in upper middle back , he describes as soreness. Pt also reports a shooting pain that occurs intermittently in epigastric area. Pt saw GI doc today, started him on protonix. Pt has tried taking zantac and prilosec at home with no relief.

## 2016-07-13 NOTE — ED Provider Notes (Signed)
[place] DEPT Provider Note   CSN: [id number] Arrival date & time: 07/13/16  1309     History   Chief Complaint Chief Complaint  Patient presents with  . Chest Pain  . Gastroesophageal Reflux  . Back Pain    HPI Jim Gonzalez is a 46 y.o. male with a h/o of GERD who presents to the emergency department Who presents to the emergency department with dull, gnawing epigastric pain with intermittent shooting that began 4 days ago with radiation to his left back that began last night. He reports he was seen by ENT for a barium swallow this AM, which demonstrated severe GERD. He reports he has taken Zantac and Protonix since symptom onset without improvement. He reports that he was concerned about his symptoms and was evaluated by urgent care after his ENT appointment who recommended workup and evaluation in the emergency department since the pain radiates to his back. He reports he has eaten a lot of take out food over the last few days and drank 3 alcoholic beverages 3 days ago. He denies diaphoresis, numbness, tingling, shortness of breath, chest pain, cough, hematemesis, nausea, vomiting, or diarrhea.  PMH includes GERD, Afib, and anxiety.   The history is provided by the patient. No language interpreter was used.    Past Medical History:  Diagnosis Date  . Anxiety   . Atrial fibrillation   . GERD (gastroesophageal reflux disease)   . Hypertension     Patient Active Problem List   Diagnosis Date Noted  . Anxiety   . GERD (gastroesophageal reflux disease)   . Atrial fibrillation 05/04/2011  . Hypertension 05/04/2011    Past Surgical History:  Procedure Laterality Date  . Left knee surgery     ACL       Home Medications    Prior to Admission medications   Medication Sig Start Date End Date Taking? Authorizing Provider  aspirin 81 MG tablet Take 162 mg by mouth daily.    Yes [provider]  diltiazem (CARDIZEM CD) 240 MG 24 hr capsule Take 1 capsule (240  mg total) by mouth daily. 03/16/16  Yes Lelon Perla, MD  Multiple Vitamin (MULITIVITAMIN WITH MINERALS) TABS Take 1 tablet by mouth daily.   Yes [provider]  pantoprazole (PROTONIX) 40 MG tablet Take 40 mg by mouth 2 (two) times daily.   Yes [provider]  ranitidine (ZANTAC) 150 MG tablet Take 150 mg by mouth 2 (two) times daily as needed for heartburn.   Yes [provider]  lidocaine (XYLOCAINE) 2 % solution Use as directed 15 mLs in the mouth or throat as needed for mouth pain. 07/13/16   Caelen Reierson A, PA-C    Family History Family History  Problem Relation Age of Onset  . Heart disease Father        Cardiomyopathy    Social History Social History  Substance Use Topics  . Smoking status: Never Smoker  . Smokeless tobacco: Never Used  . Alcohol use 1.2 oz/week    2 Glasses of wine per week     Comment: Glass of wine every other night     Allergies   Penicillins   Review of Systems Review of Systems  Constitutional: Negative for activity change, diaphoresis and fever.  Eyes: Negative for visual disturbance.  Respiratory: Negative for cough and shortness of breath.   Cardiovascular: Negative for chest pain.  Gastrointestinal: Positive for abdominal pain (epigastric). Negative for diarrhea, nausea and vomiting.  Genitourinary:  Negative for dysuria and flank pain.  Musculoskeletal: Negative for back pain.  Skin: Negative for rash.  Neurological: Negative for dizziness, syncope, weakness, numbness and headaches.   Physical Exam Updated Vital Signs BP (!) 155/101   Pulse 81   Temp 99.3 F (37.4 C) (Oral)   Resp 17   Ht 5\' 10"  (1.778 m)   Wt 81.6 kg (180 lb)   SpO2 95%   BMI 25.83 kg/m   Physical Exam  Constitutional: He appears well-developed and well-nourished. No distress.  HENT:  Head: Normocephalic.  Eyes: Conjunctivae are normal.  Neck: Normal range of motion. Neck supple.  Cardiovascular: Normal rate, regular  rhythm, normal heart sounds and intact distal pulses.  Exam reveals no gallop and no friction rub.   No murmur heard. Pulmonary/Chest: Effort normal and breath sounds normal. No respiratory distress. He has no wheezes. He has no rales.  Abdominal: Soft. Bowel sounds are normal. He exhibits no distension. There is tenderness. There is no rebound and no guarding.  TTP over the epigastric region. No guarding or rebound.   Musculoskeletal: Normal range of motion. He exhibits no edema or deformity.  Point TTP over the left mid-back. No TTP of the the cervical, thoracic, or lumbar spine.   Neurological: He is alert.  Skin: Skin is warm and dry. He is not diaphoretic.  Psychiatric: His behavior is normal.  Nursing note and vitals reviewed.  ED Treatments / Results  Labs (all labs ordered are listed, but only abnormal results are displayed) Labs Reviewed  BASIC METABOLIC PANEL - Abnormal; Notable for the following:       Result Value   Glucose, Bld 104 (*)    All other components within normal limits  CBC  I-STAT TROPOININ, ED    EKG  EKG Interpretation None       Radiology No results found.  Procedures Procedures (including critical care time)  Medications Ordered in ED Medications  gi cocktail (Maalox,Lidocaine,Donnatal) (30 mLs Oral Given 07/13/16 1919)     Initial Impression / Assessment and Plan / ED Course  I have reviewed the triage vital signs and the nursing notes.  Pertinent labs & imaging results that were available during my care of the patient were reviewed by me and considered in my medical decision making (see chart for details).     Patient presenting with GERD with likely esophagitis. Onset of symptoms 3 days ago; troponin negative. EKG with normal sinus rhythm. No electrolyte abnormalities noted on labs. No abnormalities noted on CBC. An history and physical exam with lungs clear to auscultation bilaterally, I do not feel an chest x-ray is warranted at this  time. GI cocktail given in the emergency department with symptomatic improvement. Referral to GI given. At this time, I feel the patient is safe for discharge to home. No acute distress. Vital signs stable.  Final Clinical Impressions(s) / ED Diagnoses   Final diagnoses:  GERD with esophagitis    New Prescriptions Discharge Medication List as of 07/13/2016  7:43 PM    START taking these medications   Details  lidocaine (XYLOCAINE) 2 % solution Use as directed 15 mLs in the mouth or throat as needed for mouth pain., Starting Wed 07/13/2016, Print         Zakyra Kukuk A, PA-C 07/13/16 2008    Fredia Sorrow, MD 07/15/16 [phone]

## 2016-07-13 NOTE — ED Notes (Signed)
Pt approached staff asking about wait time. Pt informed of wait and told that we are working to get him a room.

## 2016-07-13 NOTE — ED Triage Notes (Signed)
Pt. Stated, I've had chest pain with acid reflux for 2 days and what concerns me is the back pain also.

## 2016-07-13 NOTE — ED Notes (Signed)
Pt ambulating to bathroom. Pt states he his feeling much better

## 2016-07-13 NOTE — Discharge Instructions (Signed)
15 mL of viscous lidocaine In the gargle and swallow every 3 hours as needed. You can continue to take your Maalox, Protonix, and Zantac as directed. A referral to gastroenterology has been provided.  If you develop new or worsening symptoms including weakness, numbness, or tingling, please return to the emergency department for reevaluation.

## 2016-07-14 ENCOUNTER — Telehealth: Payer: Self-pay | Admitting: Cardiology

## 2016-07-14 NOTE — Telephone Encounter (Signed)
error 

## 2016-07-18 NOTE — Progress Notes (Signed)
HPI: FU atrial fibrillation. Patient had probable atrial fibrillation in January of 2013 for approximately 5 minutes. Recurrent episode in April 2013 requiring visit to ER. Patient was seen at [facility] for his atrial fibrillation. An echocardiogram showed normal LV function. The right ventricle was borderline dilated. There was mild mitral regurgitation. The patient was given a prescription for flecainide to be used as needed. Apparently he had a TSH checked by his primary care physician which was normal. Did not tolerate toprol previously; preferred ASA to NOAC. Since I last saw him, he has had worsening gastroesophageal reflux disease. His primary care physician and GI physician felt this may be contributed to by his Cardizem. He otherwise denies dyspnea, exertional chest pain, recurrent atrial fibrillation or syncope.  Current Outpatient Prescriptions  Medication Sig Dispense Refill  . aspirin 81 MG tablet Take 162 mg by mouth daily.     Marland Kitchen diltiazem (CARDIZEM CD) 240 MG 24 hr capsule Take 1 capsule (240 mg total) by mouth daily. 90 capsule 3  . lidocaine (XYLOCAINE) 2 % solution Use as directed 15 mLs in the mouth or throat as needed for mouth pain. 100 mL 0  . Multiple Vitamin (MULITIVITAMIN WITH MINERALS) TABS Take 1 tablet by mouth daily.    . pantoprazole (PROTONIX) 40 MG tablet Take 40 mg by mouth 2 (two) times daily.    . ranitidine (ZANTAC) 150 MG tablet Take 150 mg by mouth 2 (two) times daily as needed for heartburn.     No current facility-administered medications for this visit.      Past Medical History:  Diagnosis Date  . Anxiety   . Atrial fibrillation   . GERD (gastroesophageal reflux disease)   . Hypertension     Past Surgical History:  Procedure Laterality Date  . Left knee surgery     ACL    Social History   Social History  . Marital status: Married    Spouse name: N/A  . Number of children: 2  . Years of education: N/A   Occupational  History  .  [employer]   Social History Main Topics  . Smoking status: Never Smoker  . Smokeless tobacco: Never Used  . Alcohol use 1.2 oz/week    2 Glasses of wine per week     Comment: Glass of wine every other night  . Drug use: No  . Sexual activity: Not on file   Other Topics Concern  . Not on file   Social History Narrative  . No narrative on file    Family History  Problem Relation Age of Onset  . Heart disease Father        Cardiomyopathy    ROS: no fevers or chills, productive cough, hemoptysis, dysphasia, odynophagia, melena, hematochezia, dysuria, hematuria, rash, seizure activity, orthopnea, PND, pedal edema, claudication. Remaining systems are negative.  Physical Exam: Well-developed well-nourished in no acute distress.  Skin is warm and dry.  HEENT is normal.  Neck is supple.  Chest is clear to auscultation with normal expansion.  Cardiovascular exam is regular rate and rhythm.  Abdominal exam nontender or distended. No masses palpated. Extremities show no edema. neuro grossly intact   A/P  1 Paroxysmal atrial fibrillation-patient remains in sinus rhythm. He is concerned the Cardizem may be contributing to gastroesophageal reflux disease. We will therefore discontinue this medication. He had some difficulties with Toprol in the past. I will try atenolol 50 mg daily for rate control  if atrial fibrillation recurs. Continue aspirin. He has declined anticoagulation in the past for his atrial fibrillation.  2 hypertension-as outlined above we are discontinuing Cardizem. Add atenolol 50 mg daily. Follow blood pressure and adjust regimen as needed.  3 Gastroesophageal reflux disease-continue Protonix.  Kirk Ruths, MD

## 2016-07-19 ENCOUNTER — Encounter: Payer: Self-pay | Admitting: Cardiology

## 2016-07-19 ENCOUNTER — Ambulatory Visit (INDEPENDENT_AMBULATORY_CARE_PROVIDER_SITE_OTHER): Payer: BLUE CROSS/BLUE SHIELD | Admitting: Cardiology

## 2016-07-19 VITALS — BP 130/86 | HR 86 | Ht 70.0 in | Wt 185.0 lb

## 2016-07-19 DIAGNOSIS — I4891 Unspecified atrial fibrillation: Secondary | ICD-10-CM | POA: Diagnosis not present

## 2016-07-19 DIAGNOSIS — I10 Essential (primary) hypertension: Secondary | ICD-10-CM

## 2016-07-19 MED ORDER — ATENOLOL 50 MG PO TABS: 50.0000 mg | ORAL_TABLET | Freq: Every day | ORAL | 3 refills | Status: DC

## 2016-07-19 NOTE — Patient Instructions (Signed)
Medication Instructions:   STOP DILTIAZEM  START ATENOLOL 50 MG ONCE DAILY  Follow-Up:  Your physician recommends that you schedule a follow-up appointment in: [place]   If you need a refill on your cardiac medications before your next appointment, please call your pharmacy.

## 2016-07-21 ENCOUNTER — Telehealth: Payer: Self-pay | Admitting: Cardiology

## 2016-07-21 NOTE — Telephone Encounter (Signed)
Please call,he wants to talk to you about a lower dose of Atenolol. His BP was 100/78 and heart rate is running between 47 and 50.Marland Kitchen

## 2016-07-21 NOTE — Telephone Encounter (Signed)
Spoke with pt, he is feeling fine with his bp and pulse. He is going on vacation and does not want to get away and have trouble. Okay given for patient to take 25 mg daily, he will monitor his bp and pulse and take another 1/2 tablet as needed. He will call with problems or concerns.

## 2016-08-04 ENCOUNTER — Ambulatory Visit: Payer: BLUE CROSS/BLUE SHIELD | Admitting: Cardiology

## 2016-08-05 DIAGNOSIS — K219 Gastro-esophageal reflux disease without esophagitis: Secondary | ICD-10-CM | POA: Diagnosis not present

## 2016-08-05 DIAGNOSIS — R079 Chest pain, unspecified: Secondary | ICD-10-CM | POA: Diagnosis not present

## 2016-08-09 ENCOUNTER — Encounter: Payer: Self-pay | Admitting: Gastroenterology

## 2016-09-27 IMAGING — CT CT PARANASAL SINUSES LIMITED
1 series · 9 of 11 positions shown, 12 images · non-contrast
Comparison: None.

CLINICAL DATA: Sinus headache

EXAM:
CT PARANASAL SINUS LIMITED WITHOUT CONTRAST
TECHNIQUE: Non-contiguous multidetector CT images of the paranasal sinuses were
obtained in a single plane without contrast.

[Series 3: coronal soft · axial · 0.33mm/px · z∈[+18,+98]mm · 9 of 11 slices shown, 12 images]
[im 2/11  brain]
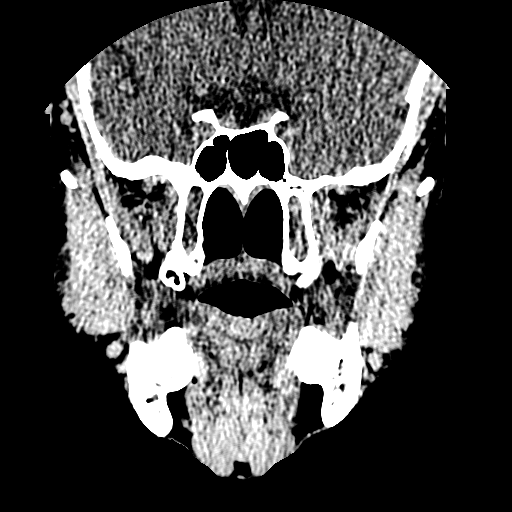
[im 2/11  bone]
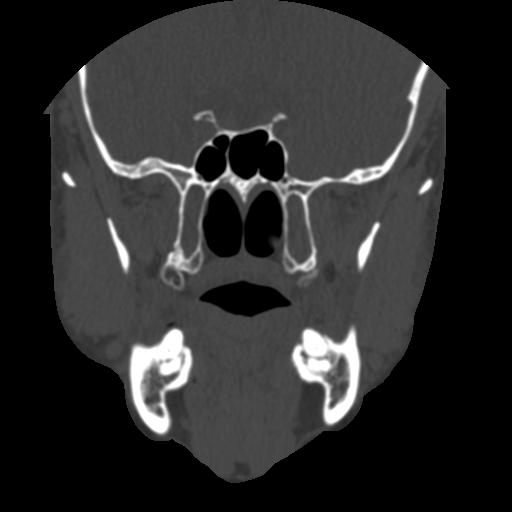
[im 3/11  bone]
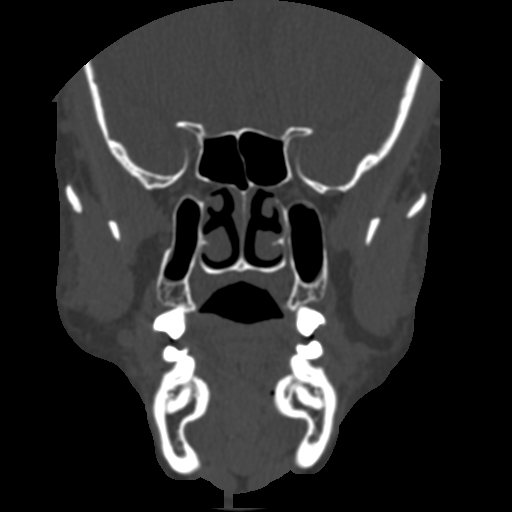
[im 4/11  bone]
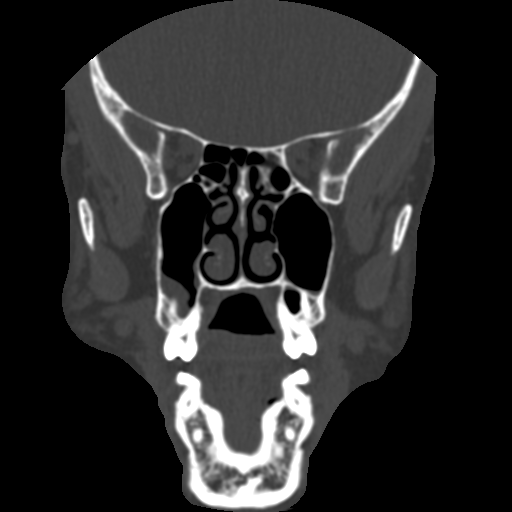
[im 5/11  bone]
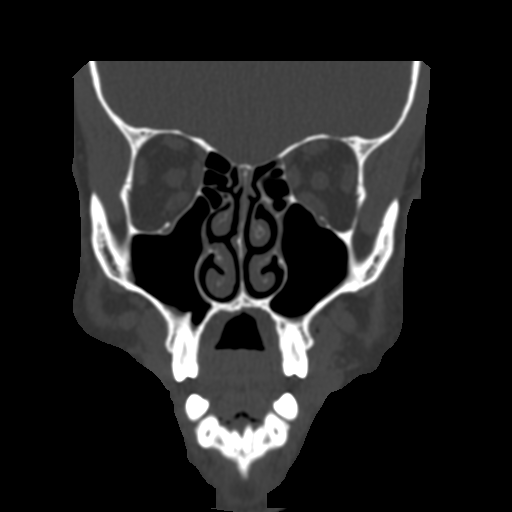
[im 6/11  brain]
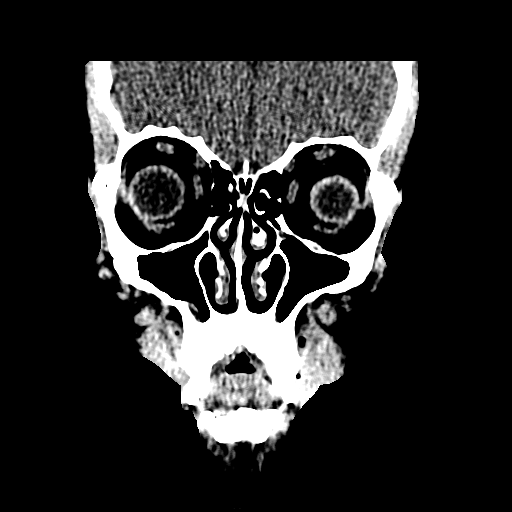
[im 6/11  bone]
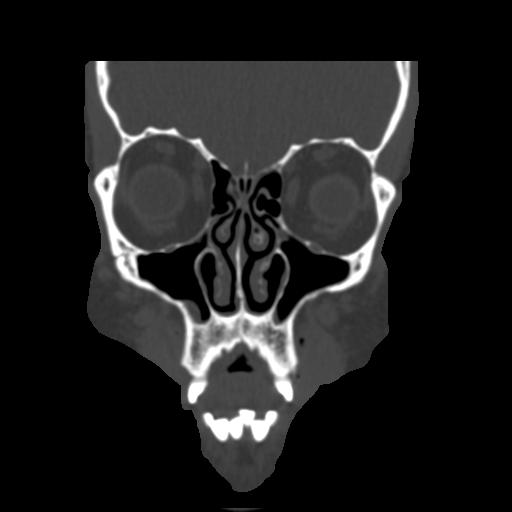
[im 7/11  bone]
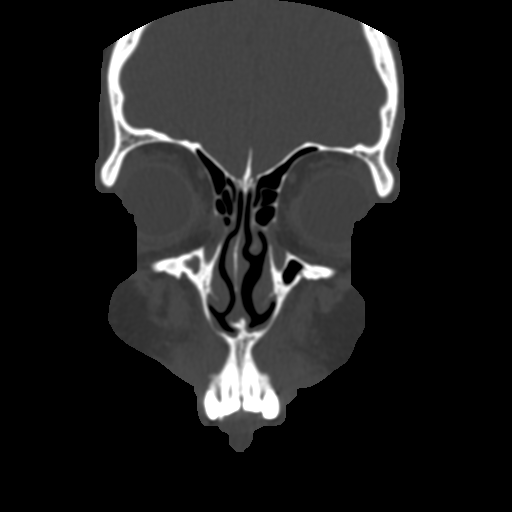
[im 8/11  bone]
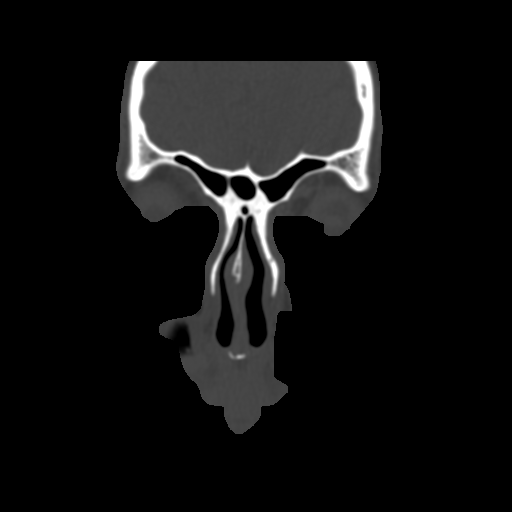
[im 9/11  bone]
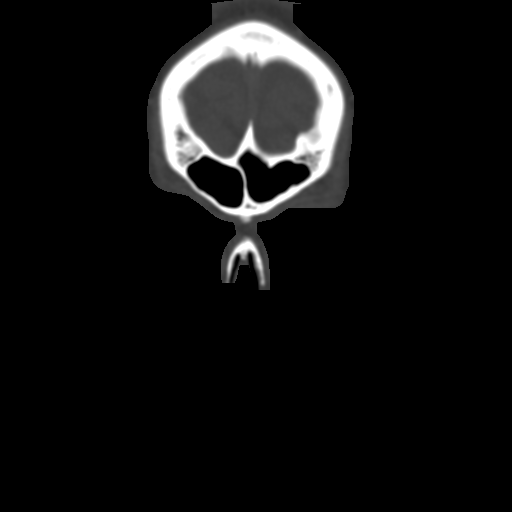
[im 10/11  brain]
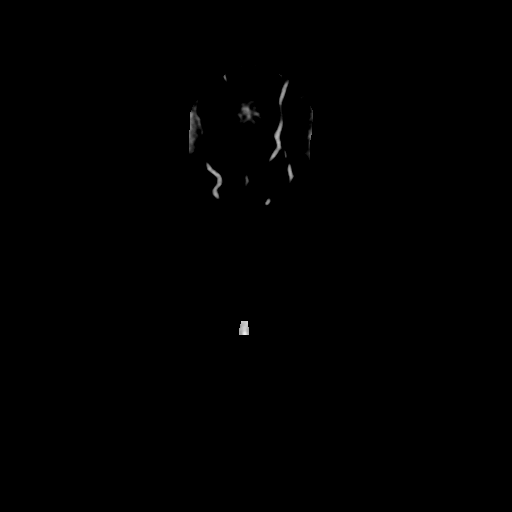
[im 10/11  bone]
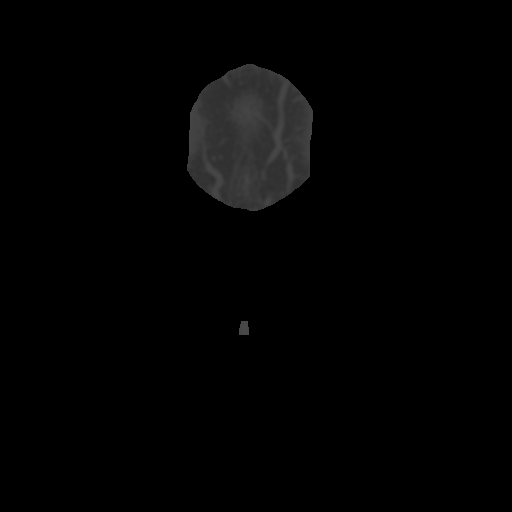

[9 of 11 positions shown; findings below may reference images not displayed]

FINDINGS: Mild mucosal edema in the right maxillary sinus without air-fluid
level. Mucosal edema narrows the right ostiomeatal complex

Mild mucosal edema left maxillary sinus. Narrowing of the
ostiomeatal complex on the left due to mucosal edema.

Mild mucosal edema in the ethmoid sinuses bilaterally. Sphenoid and
frontal sinuses clear.

Nasal septum mildly deviated to the left. No acute bony abnormality.
Regional soft tissues negative.
IMPRESSION: Mild mucosal edema in the maxillary sinus bilaterally, with
narrowing of the ostiomeatal complex bilaterally. No air-fluid
level.

## 2016-10-04 ENCOUNTER — Encounter (INDEPENDENT_AMBULATORY_CARE_PROVIDER_SITE_OTHER): Payer: Self-pay

## 2016-10-04 ENCOUNTER — Ambulatory Visit (INDEPENDENT_AMBULATORY_CARE_PROVIDER_SITE_OTHER): Payer: BLUE CROSS/BLUE SHIELD | Admitting: Gastroenterology

## 2016-10-04 ENCOUNTER — Encounter: Payer: Self-pay | Admitting: Gastroenterology

## 2016-10-04 VITALS — BP 116/80 | HR 56 | Ht 70.0 in | Wt 184.8 lb

## 2016-10-04 DIAGNOSIS — R1310 Dysphagia, unspecified: Secondary | ICD-10-CM

## 2016-10-04 DIAGNOSIS — K219 Gastro-esophageal reflux disease without esophagitis: Secondary | ICD-10-CM | POA: Diagnosis not present

## 2016-10-04 DIAGNOSIS — K200 Eosinophilic esophagitis: Secondary | ICD-10-CM

## 2016-10-04 DIAGNOSIS — R1319 Other dysphagia: Secondary | ICD-10-CM

## 2016-10-04 NOTE — Patient Instructions (Signed)
If you are age 44 or older, your body mass index should be between 23-30. Your Body mass index is 26.52 kg/m. If this is out of the aforementioned range listed, please consider follow up with your Primary Care Provider.  If you are age 48 or younger, your body mass index should be between 19-25. Your Body mass index is 26.52 kg/m. If this is out of the aformentioned range listed, please consider follow up with your Primary Care Provider.   You have been scheduled for an endoscopy. Please follow written instructions given to you at your visit today. If you use inhalers (even only as needed), please bring them with you on the day of your procedure. Your physician has requested that you go to www.startemmi.com and enter the access code given to you at your visit today. This web site gives a general overview about your procedure. However, you should still follow specific instructions given to you by our office regarding your preparation for the procedure.  Thank you for choosing [facility]  Dr Wilfrid Lund III

## 2016-10-04 NOTE — Progress Notes (Signed)
[facility] Consult Note:  History: Jim Gonzalez 10/04/2016  Referring physician: Hulan Fess, MD  Reason for consult/chief complaint: Gastroesophageal Reflux (R/O Eosinophilic esophagitis, Barium esophagram 1 month prior)   Subjective  HPI:  This is a 46 year old man referred for by primary care for another opinion regarding eosinophilic esophagitis.  He has had reflux symptoms since his 82s, which she describes as lower chest discomfort and regurgitation. He seems to get frank heartburn fairly rarely, and curiously it a cough occurs when he eats eggs. He has rare episodes of dysphagia except if he is not taking his PPI. He has taken this medicine for months at a time on and off over the last couple of decades. Kel was last seen by Dr. Lenard Simmer at [facility] for an upper endoscopy in August 2014, where there were reportedly mucosal changes suggestive of eosinophilic esophagitis. Biopsies were consistent with this, showing 25 eosinophils per high-power field. The report does not indicate any dilation was performed, but Jim Gonzalez recalls being told that it was done, and that he had improvement in dysphagia for at least a year afterwards. [name] was sent to an ENT physician several months ago for hoarseness and dysphagia. A barium study suggested a distal esophageal ring with severe reflux and normal motility and contour of the esophagus. The patient swallowed a marshmallow during the procedure and it was able to pass.   ROS:  Review of Systems  Constitutional: Negative for appetite change and unexpected weight change.  HENT: Negative for mouth sores and voice change.   Eyes: Negative for pain and redness.  Respiratory: Negative for cough and shortness of breath.   Cardiovascular: Negative for chest pain and palpitations.  Genitourinary: Negative for dysuria and hematuria.  Musculoskeletal: Negative for arthralgias and myalgias.  Skin: Negative for pallor and rash.    Neurological: Negative for weakness and headaches.  Hematological: Negative for adenopathy.     Past Medical History: Past Medical History:  Diagnosis Date  . Anxiety   . Atrial fibrillation   . Eosinophilic esophagitis   . GERD (gastroesophageal reflux disease)   . Hypertension   . IBS (irritable bowel syndrome)   . Migraines   . Schatzki's ring    PAF - see [facility] last note - changed cardizem to atenolol  Past Surgical History: Past Surgical History:  Procedure Laterality Date  . ADENOIDECTOMY    . ARTHROSCOPIC REPAIR ACL    . Left knee surgery     ACL  . MYRINGOTOMY       Family History: Family History  Problem Relation Age of Onset  . Heart disease Father        Cardiomyopathy  . GER disease Father   . Anxiety disorder Mother   . GER disease Mother   . Hypertension Mother   . Colon cancer Neg Hx   . Stomach cancer Neg Hx     Social History: Social History   Social History  . Marital status: Married    Spouse name: N/A  . Number of children: 2  . Years of education: N/A   Occupational History  .  [employer]   Social History Main Topics  . Smoking status: Never Smoker  . Smokeless tobacco: Never Used  . Alcohol use 1.2 oz/week    2 Glasses of wine per week     Comment: Glass of wine every other night  . Drug use: No  . Sexual activity: Yes    Partners: Female  Other Topics Concern  . None   Social History Narrative  . None    Allergies: Allergies  Allergen Reactions  . Penicillins Hives    Has patient had a PCN reaction causing immediate rash, facial/tongue/throat swelling, SOB or lightheadedness with hypotension: Yes Has patient had a PCN reaction causing severe rash involving mucus membranes or skin necrosis: No Has patient had a PCN reaction that required hospitalization: No Has patient had a PCN reaction occurring within the last 10 years: No If all of the above answers are "NO", then may proceed with Cephalosporin use.      Outpatient Meds: Current Outpatient Prescriptions  Medication Sig Dispense Refill  . aspirin 81 MG tablet Take 162 mg by mouth daily.     Marland Kitchen atenolol (TENORMIN) 50 MG tablet Take 1 tablet (50 mg total) by mouth daily. 90 tablet 3  . pantoprazole (PROTONIX) 40 MG tablet Take 40 mg by mouth 2 (two) times daily.     No current facility-administered medications for this visit.     protonix for few months. Had done for months at a time before  ___________________________________________________________________ Objective   Exam:  BP 116/80   Pulse (!) 56   Ht 5\' 10"  (1.778 m)   Wt 184 lb 12.8 oz (83.8 kg)   BMI 26.52 kg/m    General: this is a(n) Well-appearing man, there is some intermittent raspiness to his voice   Eyes: sclera anicteric, no redness  ENT: oral mucosa moist without lesions, no cervical or supraclavicular lymphadenopathy, good dentition. No tenderness or palpable laterality of the neck   CV: RRR without murmur, S1/S2, no JVD, no peripheral edema  Resp: clear to auscultation bilaterally, normal RR and effort noted  GI: soft , No tenderness, with active bowel sounds. No guarding or palpable organomegaly noted.  Skin; warm and dry, no rash or jaundice noted  Neuro: awake, alert and oriented x 3. Normal gross motor function and fluent speech  Outside records reviewed as noted above, these were filed in his chart.  Assessment: Encounter Diagnoses  Name Primary?  . Gastroesophageal reflux disease, esophagitis presence not specified Yes  . Esophageal dysphagia   . Eosinophilic esophagitis     It seems as if Jim Gonzalez has been feeling relatively well for the last several months back on once daily PPI. He still has intermittent feelings of lower chest pain that is nonexertional, but little or no dysphagia at present. His previous workup indicates eosinophilic esophagitis, and I wonder if this is all reflux related. His clinical course seems to indicate  PPI-responsive Eoe.  Plan:   upper endoscopy with further biopsies and possible dilation if needed. After that, I think I can help Jim Gonzalez understand his diagnostic and treatment options. He might be interested in allergy evaluation if there is established EoE.  If it seems that this is all reflux related, it may be time for pH and motility testing to see if a fundoplication would be appropriate. Naturally, we would have to be cautious with that in case there is any dysmotility related to the eosinophilic process.  Thank you for the courtesy of this consult.  Please call me with any questions or concerns.  Nelida Meuse III  CC: Hulan Fess, MD

## 2016-10-18 ENCOUNTER — Telehealth: Payer: Self-pay | Admitting: Cardiology

## 2016-10-18 DIAGNOSIS — R5383 Other fatigue: Secondary | ICD-10-CM | POA: Diagnosis not present

## 2016-10-18 DIAGNOSIS — R002 Palpitations: Secondary | ICD-10-CM | POA: Diagnosis not present

## 2016-10-18 DIAGNOSIS — Z733 Stress, not elsewhere classified: Secondary | ICD-10-CM | POA: Diagnosis not present

## 2016-10-18 NOTE — Telephone Encounter (Signed)
Pt wants to come in this morning for an ekg. His heart rate was up high last night and inever came down. This morning he took Atenolol and now is around 72.

## 2016-10-18 NOTE — Telephone Encounter (Signed)
Excerpt from MD last note from July 2018:  Paroxysmal atrial fibrillation-patient remains in sinus rhythm. He is concerned the Cardizem may be contributing to gastroesophageal reflux disease. We will therefore discontinue this medication. He had some difficulties with Toprol in the past. I will try atenolol 50 mg daily for rate control if atrial fibrillation recurs. Continue aspirin. He has declined anticoagulation in the past for his atrial fibrillation.   Patient reports HR was elevated from 11pm-5am - HR range 90-100bpm. He reports his "feet were freezing cold" and he felt tired. He thinks that he had some sort of anxiety attack - he was SOB. He is nervous about a trip he is going on. He tried deep breathing to relax but his HR would not come back down. He took atenolol 50mg  around 0530 and his HR is now around 70bpm. In the last 5 or 6 years he has only had 5 similar episodes, generally related to exercise. His heart rate monitor on his phone did not give him any indication that his rhythm was abnormal but just that his HR was elevated.  Meds: ASA 81mg , pantoprazole 40mg , atenolol 50mg   Advised would speak with DOD regarding concerns & his request for an EKG

## 2016-10-18 NOTE — Telephone Encounter (Signed)
Spoke with DOD Dr. Ellyn Hack. No need for EKG at present time. Continue to monitor symptoms.

## 2016-10-18 NOTE — Telephone Encounter (Signed)
Advised patient to continue monitoring home HR & BP. Advised him to monitor his symptoms for increasing frequency and/or severity. If he has recurrent episodes and they are noted at a particular time of the day, may need to adjust when he takes atenolol. Informed him that HR of 60-100bpm is a normal range and his HR was 86 at last visit. He voiced understanding.    Patient has MD OV 11/03/16  cc: Dr. Lyda Kalata, RN as Juluis Rainier

## 2016-10-25 NOTE — Progress Notes (Signed)
HPI: FU atrial fibrillation. Patient had probable atrial fibrillation in January of 2013 for approximately 5 minutes. Recurrent episode in April 2013 requiring visit to ER. Patient was seen at [facility] for his atrial fibrillation. An echocardiogram showed normal LV function. The right ventricle was borderline dilated. There was mild mitral regurgitation. The patient was given a prescription for flecainide to be used as needed. Apparently he had a TSH checked by his primary care physician which was normal. Did not tolerate toprol previously; cardizem changed to atenolol at last ov due to worse GERD; preferred ASA to NOAC. Since I last saw him, approximately 2 weeks ago his heart rate was mildly elevated which she attributes to anxiety. He has had no symptoms similar to his atrial fibrillation. He denies dyspnea, chest pain or syncope. He continues to have difficulties with reflux.  Current Outpatient Prescriptions  Medication Sig Dispense Refill  . aspirin 81 MG tablet Take 162 mg by mouth daily.     Marland Kitchen atenolol (TENORMIN) 50 MG tablet Take 1 tablet (50 mg total) by mouth daily. (Patient taking differently: Take 25 mg by mouth daily. ) 90 tablet 3  . pantoprazole (PROTONIX) 40 MG tablet Take 40 mg by mouth 2 (two) times daily.     No current facility-administered medications for this visit.      Past Medical History:  Diagnosis Date  . Anxiety   . Atrial fibrillation   . Eosinophilic esophagitis   . GERD (gastroesophageal reflux disease)   . Hypertension   . IBS (irritable bowel syndrome)   . Migraines   . Schatzki's ring     Past Surgical History:  Procedure Laterality Date  . ADENOIDECTOMY    . ARTHROSCOPIC REPAIR ACL    . Left knee surgery     ACL  . MYRINGOTOMY      Social History   Social History  . Marital status: Married    Spouse name: N/A  . Number of children: 2  . Years of education: N/A   Occupational History  .  [employer]   Social History  Main Topics  . Smoking status: Never Smoker  . Smokeless tobacco: Never Used  . Alcohol use 1.2 oz/week    2 Glasses of wine per week     Comment: Glass of wine every other night  . Drug use: No  . Sexual activity: Yes    Partners: Female   Other Topics Concern  . Not on file   Social History Narrative  . No narrative on file    Family History  Problem Relation Age of Onset  . Heart disease Father        Cardiomyopathy  . GER disease Father   . Anxiety disorder Mother   . GER disease Mother   . Hypertension Mother   . Colon cancer Neg Hx   . Stomach cancer Neg Hx     ROS: Gastroesophageal reflux issues no fevers or chills, productive cough, hemoptysis, dysphasia, odynophagia, melena, hematochezia, dysuria, hematuria, rash, seizure activity, orthopnea, PND, pedal edema, claudication. Remaining systems are negative.  Physical Exam: Well-developed well-nourished in no acute distress.  Skin is warm and dry.  HEENT is normal.  Neck is supple.  Chest is clear to auscultation with normal expansion.  Cardiovascular exam is regular rate and rhythm.  Abdominal exam nontender or distended. No masses palpated. Extremities show no edema. neuro grossly intact   A/P  1 Paroxysmal atrial fibrillation-patient is  in sinus rhythm today on examination. We will continue with aspirin. He has declined anticoagulation previously. Continue atenolol at present dose as his symptoms are well controlled.  2 hypertension-pressure is controlled. Continue present medications.  3 gastroesophageal reflux disease-continue present medications. Management per gastroenterology.  Kirk Ruths, MD

## 2016-11-02 ENCOUNTER — Encounter: Payer: Self-pay | Admitting: Gastroenterology

## 2016-11-03 ENCOUNTER — Encounter: Payer: Self-pay | Admitting: Cardiology

## 2016-11-03 ENCOUNTER — Ambulatory Visit (INDEPENDENT_AMBULATORY_CARE_PROVIDER_SITE_OTHER): Payer: BLUE CROSS/BLUE SHIELD | Admitting: Cardiology

## 2016-11-03 ENCOUNTER — Encounter (INDEPENDENT_AMBULATORY_CARE_PROVIDER_SITE_OTHER): Payer: Self-pay

## 2016-11-03 VITALS — BP 120/80 | HR 60 | Ht 70.0 in | Wt 185.6 lb

## 2016-11-03 DIAGNOSIS — I4891 Unspecified atrial fibrillation: Secondary | ICD-10-CM | POA: Diagnosis not present

## 2016-11-03 DIAGNOSIS — I10 Essential (primary) hypertension: Secondary | ICD-10-CM

## 2016-11-03 NOTE — Patient Instructions (Signed)
Your physician wants you to follow-up in: ONE YEAR WITH DR CRENSHAW You will receive a reminder letter in the mail two months in advance. If you don't receive a letter, please call our office to schedule the follow-up appointment.   If you need a refill on your cardiac medications before your next appointment, please call your pharmacy.  

## 2016-11-04 DIAGNOSIS — Z713 Dietary counseling and surveillance: Secondary | ICD-10-CM | POA: Diagnosis not present

## 2016-11-09 ENCOUNTER — Encounter: Payer: BLUE CROSS/BLUE SHIELD | Admitting: Gastroenterology

## 2016-11-16 ENCOUNTER — Encounter: Payer: BLUE CROSS/BLUE SHIELD | Admitting: Gastroenterology

## 2016-12-01 ENCOUNTER — Encounter: Payer: Self-pay | Admitting: Gastroenterology

## 2016-12-01 ENCOUNTER — Ambulatory Visit (AMBULATORY_SURGERY_CENTER): Payer: BLUE CROSS/BLUE SHIELD | Admitting: Gastroenterology

## 2016-12-01 VITALS — BP 121/78 | HR 70 | Temp 97.8°F | Resp 14 | Ht 70.0 in | Wt 184.0 lb

## 2016-12-01 DIAGNOSIS — R1319 Other dysphagia: Secondary | ICD-10-CM

## 2016-12-01 DIAGNOSIS — K200 Eosinophilic esophagitis: Secondary | ICD-10-CM | POA: Diagnosis not present

## 2016-12-01 DIAGNOSIS — R1310 Dysphagia, unspecified: Secondary | ICD-10-CM | POA: Diagnosis not present

## 2016-12-01 DIAGNOSIS — K219 Gastro-esophageal reflux disease without esophagitis: Secondary | ICD-10-CM | POA: Diagnosis not present

## 2016-12-01 MED ORDER — SODIUM CHLORIDE 0.9 % IV SOLN: 500.0000 mL | INTRAVENOUS | Status: DC

## 2016-12-01 NOTE — Patient Instructions (Signed)
Discharge instructions given. Normal exam. Resume previous medications. YOU HAD AN ENDOSCOPIC PROCEDURE TODAY AT THE [facility]:   Refer to the procedure report that was given to you for any specific questions about what was found during the examination.  If the procedure report does not answer your questions, please call your gastroenterologist to clarify.  If you requested that your care partner not be given the details of your procedure findings, then the procedure report has been included in a sealed envelope for you to review at your convenience later.  YOU SHOULD EXPECT: Some feelings of bloating in the abdomen. Passage of more gas than usual.  Walking can help get rid of the air that was put into your GI tract during the procedure and reduce the bloating. If you had a lower endoscopy (such as a colonoscopy or flexible sigmoidoscopy) you may notice spotting of blood in your stool or on the toilet paper. If you underwent a bowel prep for your procedure, you may not have a normal bowel movement for a few days.  Please Note:  You might notice some irritation and congestion in your nose or some drainage.  This is from the oxygen used during your procedure.  There is no need for concern and it should clear up in a day or so.  SYMPTOMS TO REPORT IMMEDIATELY:   Following upper endoscopy (EGD)  Vomiting of blood or coffee ground material  New chest pain or pain under the shoulder blades  Painful or persistently difficult swallowing  New shortness of breath  Fever of 100F or higher  Black, tarry-looking stools  For urgent or emergent issues, a gastroenterologist can be reached at any hour by calling [phone].   DIET:  We do recommend a small meal at first, but then you may proceed to your regular diet.  Drink plenty of fluids but you should avoid alcoholic beverages for 24 hours.  ACTIVITY:  You should plan to take it easy for the rest of today and you should NOT DRIVE or  use heavy machinery until tomorrow (because of the sedation medicines used during the test).    FOLLOW UP: Our staff will call the number listed on your records the next business day following your procedure to check on you and address any questions or concerns that you may have regarding the information given to you following your procedure. If we do not reach you, we will leave a message.  However, if you are feeling well and you are not experiencing any problems, there is no need to return our call.  We will assume that you have returned to your regular daily activities without incident.  If any biopsies were taken you will be contacted by phone or by letter within the next 1-3 weeks.  Please call us at [phone] if you have not heard about the biopsies in 3 weeks.    SIGNATURES/CONFIDENTIALITY: You and/or your care partner have signed paperwork which will be entered into your electronic medical record.  These signatures attest to the fact that that the information above on your After Visit Summary has been reviewed and is understood.  Full responsibility of the confidentiality of this discharge information lies with you and/or your care-partner. 

## 2016-12-01 NOTE — Op Note (Signed)
[facility] Patient Name: Jim Gonzalez Procedure Date: 12/01/2016 10:15 AM MRN: [id number] Endoscopist: Mallie Mussel L. Loletha Carrow , MD Age: 46 Referring MD:  Date of Birth: [birth year] Gender: Male Account #: [id number] Procedure:                Upper GI endoscopy Indications:              Dysphagia, Suspected esophageal reflux Medicines:                Monitored Anesthesia Care Procedure:                Pre-Anesthesia Assessment:                           - Prior to the procedure, a History and Physical                            was performed, and patient medications and                            allergies were reviewed. The patient's tolerance of                            previous anesthesia was also reviewed. The risks                            and benefits of the procedure and the sedation                            options and risks were discussed with the patient.                            All questions were answered, and informed consent                            was obtained. Anticoagulants: The patient has taken                            aspirin. It was decided not to withhold this                            medication prior to the procedure. ASA Grade                            Assessment: II - A patient with mild systemic                            disease. After reviewing the risks and benefits,                            the patient was deemed in satisfactory condition to                            undergo the procedure.  After obtaining informed consent, the endoscope was                            passed under direct vision. Throughout the                            procedure, the patient's blood pressure, pulse, and                            oxygen saturations were monitored continuously. The                            Endoscope was introduced through the mouth, and                            advanced to the second part of duodenum. The  upper                            GI endoscopy was accomplished without difficulty.                            The patient tolerated the procedure well. Scope In: Scope Out: Findings:                 The larynx was normal.                           The examined esophagus was normal. Multiple                            biopsies were obtained in the middle third of the                            esophagus and in the lower third of the esophagus                            with cold forceps for evaluation of eosinophilic                            esophagitis because of prior testing suggesting                            this diagnosis. There were no mucosal changes of                            EoE. No stricture or ring was seen, thus no                            dilation was performed. No hiatal hernia seen.                           The stomach was normal.  The cardia and gastric fundus were normal on                            retroflexion.                           The examined duodenum was normal. Complications:            No immediate complications. Estimated Blood Loss:     Estimated blood loss was minimal. Impression:               - Normal larynx.                           - Normal esophagus.                           - Normal stomach.                           - Normal examined duodenum.                           - Multiple biopsies were obtained in the middle                            third of the esophagus and in the lower third of                            the esophagus. Recommendation:           - Patient has a contact number available for                            emergencies. The signs and symptoms of potential                            delayed complications were discussed with the                            patient. Return to normal activities tomorrow.                            Written discharge instructions were provided to the                             patient.                           - Resume previous diet.                           - Continue present medications.                           - Await pathology results. Then probable esophageal  pH and motility testing. [name], MD 12/01/2016 10:40:51 AM This report has been signed electronically.

## 2016-12-01 NOTE — Progress Notes (Signed)
Called to room to assist during endoscopic procedure.  Patient ID and intended procedure confirmed with present staff. Received instructions for my participation in the procedure from the performing physician.  

## 2016-12-01 NOTE — Progress Notes (Signed)
To PACU Pt awake and alert. Report to RN 

## 2016-12-02 ENCOUNTER — Telehealth: Payer: Self-pay

## 2016-12-02 ENCOUNTER — Telehealth: Payer: Self-pay | Admitting: *Deleted

## 2016-12-02 NOTE — Telephone Encounter (Signed)
  Follow up Call-  Call back number 12/01/2016  Post procedure Call Back phone  # [phone]  Permission to leave phone message Yes  Some recent data might be hidden     Patient questions:  Message left to call us if necessary.

## 2016-12-02 NOTE — Telephone Encounter (Signed)
No ID on answering machine Jim Gonzalez/RR

## 2016-12-13 ENCOUNTER — Encounter: Payer: Self-pay | Admitting: Gastroenterology

## 2016-12-23 ENCOUNTER — Telehealth: Payer: Self-pay | Admitting: Cardiology

## 2016-12-23 MED ORDER — ATENOLOL 50 MG PO TABS: 50.0000 mg | ORAL_TABLET | Freq: Every day | ORAL | 3 refills | Status: DC

## 2016-12-23 NOTE — Telephone Encounter (Signed)
°  New Prob   *STAT* If patient is at the pharmacy, call can be transferred to refill team.   1. Which medications need to be refilled? (please list name of each medication and dose if known) atenolol (TENORMIN) 25 MG tablet  2. Which pharmacy/location (including street and city if local pharmacy) is medication to be sent to? [facility] in [place]  3. Do they need a 30 day or 90 day supply? [place]

## 2016-12-26 ENCOUNTER — Other Ambulatory Visit: Payer: Self-pay | Admitting: Cardiology

## 2016-12-28 MED ORDER — ATENOLOL 25 MG PO TABS: 25.0000 mg | ORAL_TABLET | Freq: Every day | ORAL | 3 refills | Status: DC

## 2017-01-11 DIAGNOSIS — Z713 Dietary counseling and surveillance: Secondary | ICD-10-CM | POA: Diagnosis not present

## 2017-02-01 DIAGNOSIS — Z713 Dietary counseling and surveillance: Secondary | ICD-10-CM | POA: Diagnosis not present

## 2017-02-06 ENCOUNTER — Ambulatory Visit: Payer: BLUE CROSS/BLUE SHIELD | Admitting: Gastroenterology

## 2017-02-22 DIAGNOSIS — Z713 Dietary counseling and surveillance: Secondary | ICD-10-CM | POA: Diagnosis not present

## 2017-06-30 DIAGNOSIS — S29011A Strain of muscle and tendon of front wall of thorax, initial encounter: Secondary | ICD-10-CM | POA: Diagnosis not present

## 2017-08-18 DIAGNOSIS — R0600 Dyspnea, unspecified: Secondary | ICD-10-CM | POA: Diagnosis not present

## 2017-09-19 DIAGNOSIS — D225 Melanocytic nevi of trunk: Secondary | ICD-10-CM | POA: Diagnosis not present

## 2017-09-19 DIAGNOSIS — Z86018 Personal history of other benign neoplasm: Secondary | ICD-10-CM | POA: Diagnosis not present

## 2017-10-05 DIAGNOSIS — H6123 Impacted cerumen, bilateral: Secondary | ICD-10-CM | POA: Diagnosis not present

## 2017-10-05 DIAGNOSIS — R8290 Unspecified abnormal findings in urine: Secondary | ICD-10-CM | POA: Diagnosis not present

## 2017-10-05 DIAGNOSIS — Z0000 Encounter for general adult medical examination without abnormal findings: Secondary | ICD-10-CM | POA: Diagnosis not present

## 2017-10-06 DIAGNOSIS — Z0000 Encounter for general adult medical examination without abnormal findings: Secondary | ICD-10-CM | POA: Diagnosis not present

## 2017-10-06 DIAGNOSIS — I10 Essential (primary) hypertension: Secondary | ICD-10-CM | POA: Diagnosis not present

## 2017-11-02 NOTE — Progress Notes (Signed)
HPI: FU atrial fibrillation. Patient had probable atrial fibrillation in January of 2013 for approximately 5 minutes. Recurrent episode in April 2013 requiring visit to ER. Patient was seen at [facility] for his atrial fibrillation. An echocardiogram showed normal LV function. The right ventricle was borderline dilated. There was mild mitral regurgitation. The patient was given a prescription for flecainide to be used as needed. Apparently he had a TSH checked by his primary care physician which was normal. Did not tolerate toprol previously; cardizem changed to atenolol at previous ov due to worse GERD; preferred ASA to NOAC. Since I last saw him, the patient denies any dyspnea on exertion, orthopnea, PND, pedal edema, syncope or chest pain.  Rare brief flutter but no sustained palpitations.   Current Outpatient Medications  Medication Sig Dispense Refill  . aspirin 81 MG tablet Take 162 mg by mouth daily.     Marland Kitchen atenolol (TENORMIN) 25 MG tablet Take 1 tablet (25 mg total) by mouth daily. 90 tablet 3  . atenolol (TENORMIN) 50 MG tablet TAKE 1 TABLET BY MOUTH EVERY DAY 90 tablet 3   No current facility-administered medications for this visit.      Past Medical History:  Diagnosis Date  . Allergy   . Anxiety   . Atrial fibrillation   . Eosinophilic esophagitis   . GERD (gastroesophageal reflux disease)   . Hypertension   . IBS (irritable bowel syndrome)   . Migraines   . Schatzki's ring     Past Surgical History:  Procedure Laterality Date  . ADENOIDECTOMY    . ARTHROSCOPIC REPAIR ACL    . Left knee surgery     ACL  . MYRINGOTOMY      Social History   Socioeconomic History  . Marital status: Married    Spouse name: Not on file  . Number of children: 2  . Years of education: Not on file  . Highest education level: Not on file  Occupational History    Employer: [employer]    Comment: [employer]  Social Needs  . Financial resource strain: Not on file  . Food insecurity:     Worry: Not on file    Inability: Not on file  . Transportation needs:    Medical: Not on file    Non-medical: Not on file  Tobacco Use  . Smoking status: Never Smoker  . Smokeless tobacco: Never Used  Substance and Sexual Activity  . Alcohol use: Yes    Alcohol/week: 2.0 standard drinks    Types: 2 Glasses of wine per week    Comment: Glass of wine every other night  . Drug use: No  . Sexual activity: Yes    Partners: Female  Lifestyle  . Physical activity:    Days per week: Not on file    Minutes per session: Not on file  . Stress: Not on file  Relationships  . Social connections:    Talks on phone: Not on file    Gets together: Not on file    Attends religious service: Not on file    Active member of club or organization: Not on file    Attends meetings of clubs or organizations: Not on file    Relationship status: Not on file  . Intimate partner violence:    Fear of current or ex partner: Not on file    Emotionally abused: Not on file    Physically abused: Not on file    Forced sexual activity:  Not on file  Other Topics Concern  . Not on file  Social History Narrative  . Not on file    Family History  Problem Relation Age of Onset  . Heart disease Father        Cardiomyopathy  . GER disease Father   . Anxiety disorder Mother   . GER disease Mother   . Hypertension Mother   . Colon cancer Neg Hx   . Stomach cancer Neg Hx   . Esophageal cancer Neg Hx   . Rectal cancer Neg Hx     ROS: Gastroesophageal reflux disease symptoms but no fevers or chills, productive cough, hemoptysis, dysphasia, odynophagia, melena, hematochezia, dysuria, hematuria, rash, seizure activity, orthopnea, PND, pedal edema, claudication. Remaining systems are negative.  Physical Exam: Well-developed well-nourished in no acute distress.  Skin is warm and dry.  HEENT is normal.  Neck is supple.  Chest is clear to auscultation with normal expansion.  Cardiovascular exam is regular  rate and rhythm.  Abdominal exam nontender or distended. No masses palpated. Extremities show no edema. neuro grossly intact  ECG-sinus bradycardia with probable early repolarization abnormality.  Personally reviewed  A/P  1 paroxysmal atrial fibrillation-patient remains in sinus rhythm today.  Plan to continue aspirin.  He declined anticoagulation previously.  We will continue with present dose of atenolol for rate control if atrial fibrillation recurs.  2 hypertension-patient's blood pressure is mildly elevated.  However he follows this at home and it is typically controlled.  Continue present medications and follow.  3 gastroesophageal reflux disease-managed by primary care.  Kirk Ruths, MD

## 2017-11-16 ENCOUNTER — Ambulatory Visit: Payer: BLUE CROSS/BLUE SHIELD | Admitting: Cardiology

## 2017-11-16 ENCOUNTER — Encounter: Payer: Self-pay | Admitting: Cardiology

## 2017-11-16 VITALS — BP 122/92 | HR 54 | Ht 70.0 in | Wt 186.4 lb

## 2017-11-16 DIAGNOSIS — I10 Essential (primary) hypertension: Secondary | ICD-10-CM | POA: Diagnosis not present

## 2017-11-16 NOTE — Patient Instructions (Signed)
Medication Instructions:  Your physician recommends that you continue on your current medications as directed. Please refer to the Current Medication list given to you today.  If you need a refill on your cardiac medications before your next appointment, please call your pharmacy.   Lab work: none If you have labs (blood work) drawn today and your tests are completely normal, you will receive your results only by: Marland Kitchen MyChart Message (if you have MyChart) OR . A paper copy in the mail If you have any lab test that is abnormal or we need to change your treatment, we will call you to review the results.  Testing/Procedures: none  Follow-Up: At [facility], you and your health needs are our priority.  As part of our continuing mission to provide you with exceptional heart care, we have created designated Provider Care Teams.  These Care Teams include your primary Cardiologist (physician) and Advanced Practice Providers (APPs -  Physician Assistants and Nurse Practitioners) who all work together to provide you with the care you need, when you need it. You will need a follow up appointment in 12 months.  Please call our office 2 months in advance to schedule this appointment.  You may see Kirk Ruths, MD or one of the following Advanced Practice Providers on your designated Care Team:   Kerin Ransom, PA-C Roby Lofts, [place] . Sande Rives, PA-C  Any Other Special Instructions Will Be Listed Below (If Applicable).

## 2017-12-20 ENCOUNTER — Other Ambulatory Visit: Payer: Self-pay | Admitting: Cardiology

## 2018-02-01 NOTE — Progress Notes (Signed)
[facility] Progress Note  Chief Complaint: GERD and bloating  Subjective  History:  Jim Gonzalez saw me in October he 20 for chronic reflux and dysphagia.  He had been evaluated by ENT, and barium swallow suggested stricture and/or motility disorder of distal esophagus.  Prior work-up by Dr. Amedeo [facility] with [facility] suggested possible eosinophilic esophagitis, however he was not having much dysphagia as long as he stayed on PPI. Upper endoscopy with me November 2018 no mucosal changes of eosinophilic esophagitis, no ring or stricture thus no ablation was performed.  Biopsies from the distal and mid esophagus were normal.  He is bothered by intermittent epigastric/lower sternal burning that might happen for a day or 2, and then he will take Nexium for several days or maybe a week.  He has been sleeping with a bed wedge but would rather not do this.  He has now been taking Nexium daily for the last month and has had a few episodes of upper abdominal bloating.  He denies dysphagia or odynophagia.  Appetite is good and weight stable. He had many questions regarding the long-term use and safety of H2 blocker and PPI medicine, and if there are other options to control reflux.  ROS: Cardiovascular:  no chest pain Respiratory: no dyspnea  The patient's Past Medical, Family and Social History were reviewed and are on file in the EMR.  Objective:  Med list reviewed  Current Outpatient Medications:  .  atenolol (TENORMIN) 25 MG tablet, TAKE 1 TABLET BY MOUTH EVERY DAY, Disp: 90 tablet, Rfl: 3   Vital signs in last 24 hrs: Vitals:   02/02/18 1141  BP: 138/84  Pulse: 74    Physical Exam  He is well-appearing, good muscle mass, normal vocal quality  HEENT: sclera anicteric, oral mucosa moist without lesions  Neck: supple, no thyromegaly, JVD or lymphadenopathy  Cardiac: RRR without murmurs, S1S2 heard, no peripheral edema  Pulm: clear to auscultation bilaterally, normal RR and effort  noted  Abdomen: soft, no tenderness, with active bowel sounds. No guarding or palpable hepatosplenomegaly.  Skin; warm and dry, no jaundice or rash  Radiologic studies:  Last imaging was normal abd [place] 2014  @ASSESSMENTPLANBEGIN @ Assessment: Encounter Diagnoses  Name Primary?  . Gastroesophageal reflux disease, esophagitis presence not specified Yes  . Abdominal bloating     I believe he has GERD based on his symptoms and the 2014 endoscopic findings that improved with PPI.  He does not have eosinophilic esophagitis as evidenced by 2018 endoscopy and biopsies.  We discussed the nature of GERD, limitation of acid suppression medicines, and the need for diet and lifestyle measures.  Even with all that, there are often some breakthrough symptoms.  Nevertheless, I gave him options of:  #1 ongoing medical management with chronic but less frequent dosing of PPI or H2 blocker (his preference) with continued attention to diet and lifestyle measures including not eating with several hours of bed, re-dosing the acid suppression to suppertime, and using a bed wedge or mechanical bed.  Versus #2 consideration of endoscopic or surgical therapy for reflux.  If he were to consider that, he would need dedicated pH and manometry studies and gastric emptying.  We would only know after those results whether he has sufficient reflux to predict good symptom relief afterwards.  I suspect he has some element of functional heartburn and bloating, so he could have persistent symptoms after such procedures.  We extensively discussed use of medicines, diet and lifestyle measures  for reflux and gave him written materials on this.  He is inclined to continue medical management for now and will see me as needed.  Total time 30 minutes, over half spent face-to-face with patient in counseling and coordination of care.   Jim Gonzalez

## 2018-02-02 ENCOUNTER — Encounter (INDEPENDENT_AMBULATORY_CARE_PROVIDER_SITE_OTHER): Payer: Self-pay

## 2018-02-02 ENCOUNTER — Ambulatory Visit: Payer: BLUE CROSS/BLUE SHIELD | Admitting: Gastroenterology

## 2018-02-02 ENCOUNTER — Encounter: Payer: Self-pay | Admitting: Gastroenterology

## 2018-02-02 VITALS — BP 138/84 | HR 74 | Ht 69.0 in | Wt 181.4 lb

## 2018-02-02 DIAGNOSIS — R140 Abdominal distension (gaseous): Secondary | ICD-10-CM

## 2018-02-02 DIAGNOSIS — K219 Gastro-esophageal reflux disease without esophagitis: Secondary | ICD-10-CM | POA: Diagnosis not present

## 2018-02-02 NOTE — Patient Instructions (Signed)
If you are age 46 or older, your body mass index should be between 23-30. Your Body mass index is 26.78 kg/m. If this is out of the aforementioned range listed, please consider follow up with your Primary Care Provider.  If you are age 85 or younger, your body mass index should be between 19-25. Your Body mass index is 26.78 kg/m. If this is out of the aformentioned range listed, please consider follow up with your Primary Care Provider.   It was a pleasure to see you today!  Dr. Loletha Carrow  Food Choices for Gastroesophageal Reflux Disease, Adult When you have gastroesophageal reflux disease (GERD), the foods you eat and your eating habits are very important. Choosing the right foods can help ease your discomfort. Think about working with a nutrition specialist (dietitian) to help you make good choices. What are tips for following this plan?  Meals  Choose healthy foods that are low in fat, such as fruits, vegetables, whole grains, low-fat dairy products, and lean meat, fish, and poultry.  Eat small meals often instead of 3 large meals a day. Eat your meals slowly, and in a place where you are relaxed. Avoid bending over or lying down until 2-3 hours after eating.  Avoid eating meals 2-3 hours before bed.  Avoid drinking a lot of liquid with meals.  Cook foods using methods other than frying. Bake, grill, or broil food instead.  Avoid or limit: ? Chocolate. ? Peppermint or spearmint. ? Alcohol. ? Pepper. ? Black and decaffeinated coffee. ? Black and decaffeinated tea. ? Bubbly (carbonated) soft drinks. ? Caffeinated energy drinks and soft drinks.  Limit high-fat foods such as: ? Fatty meat or fried foods. ? Whole milk, cream, butter, or ice cream. ? Nuts and nut butters. ? Pastries, donuts, and sweets made with butter or shortening.  Avoid foods that cause symptoms. These foods may be different for everyone. Common foods that cause symptoms include: ? Tomatoes. ? Oranges,  lemons, and limes. ? Peppers. ? Spicy food. ? Onions and garlic. ? Vinegar. Lifestyle  Maintain a healthy weight. Ask your doctor what weight is healthy for you. If you need to lose weight, work with your doctor to do so safely.  Exercise for at least 30 minutes for 5 or more days each week, or as told by your doctor.  Wear loose-fitting clothes.  Do not smoke. If you need help quitting, ask your doctor.  Sleep with the head of your bed higher than your feet. Use a wedge under the mattress or blocks under the bed frame to raise the head of the bed. Summary  When you have gastroesophageal reflux disease (GERD), food and lifestyle choices are very important in easing your symptoms.  Eat small meals often instead of 3 large meals a day. Eat your meals slowly, and in a place where you are relaxed.  Limit high-fat foods such as fatty meat or fried foods.  Avoid bending over or lying down until 2-3 hours after eating.  Avoid peppermint and spearmint, caffeine, alcohol, and chocolate. This information is not intended to replace advice given to you by your health care provider. Make sure you discuss any questions you have with your health care provider. Document Released: 06/21/2011 Document Revised: 01/26/2016 Document Reviewed: 01/26/2016 Elsevier Interactive Patient Education  2019 [employer].

## 2018-09-24 DIAGNOSIS — M79601 Pain in right arm: Secondary | ICD-10-CM | POA: Diagnosis not present

## 2018-09-24 DIAGNOSIS — M25511 Pain in right shoulder: Secondary | ICD-10-CM | POA: Diagnosis not present

## 2018-09-24 DIAGNOSIS — S29011A Strain of muscle and tendon of front wall of thorax, initial encounter: Secondary | ICD-10-CM | POA: Diagnosis not present

## 2018-10-01 DIAGNOSIS — M25511 Pain in right shoulder: Secondary | ICD-10-CM | POA: Diagnosis not present

## 2018-10-01 DIAGNOSIS — S29011A Strain of muscle and tendon of front wall of thorax, initial encounter: Secondary | ICD-10-CM | POA: Diagnosis not present

## 2018-10-03 DIAGNOSIS — S29011A Strain of muscle and tendon of front wall of thorax, initial encounter: Secondary | ICD-10-CM | POA: Diagnosis not present

## 2018-10-03 DIAGNOSIS — M25511 Pain in right shoulder: Secondary | ICD-10-CM | POA: Diagnosis not present

## 2018-10-05 DIAGNOSIS — S29011D Strain of muscle and tendon of front wall of thorax, subsequent encounter: Secondary | ICD-10-CM | POA: Diagnosis not present

## 2018-10-05 DIAGNOSIS — M79601 Pain in right arm: Secondary | ICD-10-CM | POA: Diagnosis not present

## 2018-10-11 DIAGNOSIS — K219 Gastro-esophageal reflux disease without esophagitis: Secondary | ICD-10-CM | POA: Diagnosis not present

## 2018-10-11 DIAGNOSIS — I10 Essential (primary) hypertension: Secondary | ICD-10-CM | POA: Diagnosis not present

## 2018-10-11 DIAGNOSIS — S29011A Strain of muscle and tendon of front wall of thorax, initial encounter: Secondary | ICD-10-CM | POA: Diagnosis not present

## 2018-10-17 DIAGNOSIS — R8290 Unspecified abnormal findings in urine: Secondary | ICD-10-CM | POA: Diagnosis not present

## 2018-10-17 DIAGNOSIS — Z0000 Encounter for general adult medical examination without abnormal findings: Secondary | ICD-10-CM | POA: Diagnosis not present

## 2018-10-17 DIAGNOSIS — Z125 Encounter for screening for malignant neoplasm of prostate: Secondary | ICD-10-CM | POA: Diagnosis not present

## 2018-10-17 DIAGNOSIS — I10 Essential (primary) hypertension: Secondary | ICD-10-CM | POA: Diagnosis not present

## 2018-10-17 DIAGNOSIS — Z8249 Family history of ischemic heart disease and other diseases of the circulatory system: Secondary | ICD-10-CM | POA: Diagnosis not present

## 2018-10-23 DIAGNOSIS — Z7289 Other problems related to lifestyle: Secondary | ICD-10-CM | POA: Diagnosis not present

## 2018-10-23 DIAGNOSIS — H6123 Impacted cerumen, bilateral: Secondary | ICD-10-CM | POA: Diagnosis not present

## 2018-10-23 DIAGNOSIS — H9313 Tinnitus, bilateral: Secondary | ICD-10-CM | POA: Diagnosis not present

## 2018-10-26 ENCOUNTER — Telehealth: Payer: Self-pay | Admitting: Internal Medicine

## 2018-10-26 DIAGNOSIS — Z20828 Contact with and (suspected) exposure to other viral communicable diseases: Secondary | ICD-10-CM | POA: Diagnosis not present

## 2018-10-26 DIAGNOSIS — Z01812 Encounter for preprocedural laboratory examination: Secondary | ICD-10-CM | POA: Diagnosis not present

## 2018-10-26 DIAGNOSIS — S29011A Strain of muscle and tendon of front wall of thorax, initial encounter: Secondary | ICD-10-CM | POA: Diagnosis not present

## 2018-10-26 NOTE — Telephone Encounter (Signed)
Cardiology Moonlighter Note  Returned page from patient. Has a history of AF. Seen by Dr. Stanford Breed. Patient was running today and feels as though he went into AF. Around 3 hours ago. Feels palpitations but without tachycardia. No CP, SOB, lightheadedness, dizziness. Eating and drinking normally. Had 2 alcoholic drinks today. No excess caffeine or illicit substances. Took 2 tablets of flecainide (150mg  each) about 2 hours ago. Still no improvement.   Given that patient is stable, no need for patient to come to ED tonight. Recommended he do his best to avoid triggers. Monitor heart rate. Monitor for symptoms. Gave him instructions on what to watch for that would be concerning (CP, SOB, syncope, etc). Understands he should come to the ED for any of these symptoms. If he remains in symptomatic AF tomorrow, he will call back to discuss options.   A copy of this note will be sent to patient's cardiologist, Dr Stanford Breed.   Marcie Mowers, MD Cardiology Fellow, PGY-7

## 2018-10-28 NOTE — Telephone Encounter (Signed)
Fu with pt to see if his symptoms improved Jim Gonzalez

## 2018-10-29 ENCOUNTER — Telehealth: Payer: Self-pay | Admitting: Cardiology

## 2018-10-29 NOTE — Telephone Encounter (Signed)
Left message for pt to call,? Type of surgery?

## 2018-10-29 NOTE — Telephone Encounter (Signed)
Patient aware of clearance info, advice concerning medications

## 2018-10-29 NOTE — Telephone Encounter (Signed)
Ok for surgery as only issue is atrial fibrillation Jim Gonzalez

## 2018-10-29 NOTE — Telephone Encounter (Signed)
New Message ° ° °Patient returning your call. °

## 2018-10-29 NOTE — Telephone Encounter (Signed)
Spoke with pt, he is going to get the folks at [facility] to fax surgical clearance for shoulder surgery.

## 2018-10-29 NOTE — Telephone Encounter (Signed)
New Message  Patient is calling in to see if he needs to have a clearance done to have an outpatient surgery on Friday. Patient also wants to know if the appointment can be done virtually if one is needed. Please give patient a call back to confirm.

## 2018-10-29 NOTE — Telephone Encounter (Signed)
   Primary Cardiologist: Kirk Ruths, MD  Chart reviewed as part of pre-operative protocol coverage. He does not have a history of MI or stroke. He has PAF, for which he has refused anticoagulation and was taking 162 mg ASA at his last clinic visit on 11/16/17.   Per Dr. Stanford Breed: "OK for suregry."  He may hold ASA 5-7 days prior to surgery.   Therefore, based on ACC/AHA guidelines, the patient would be at acceptable risk for the planned procedure without further cardiovascular testing.   I will route this recommendation to the requesting party via Epic fax function and remove from pre-op pool.  Please call with questions.  Tami Lin Duke, PA 10/29/2018, 4:14 PM

## 2018-10-29 NOTE — Telephone Encounter (Signed)
Spoke with pt, he reports he was out of rhythm for about 3 hours and he is feeling fine now.

## 2018-10-29 NOTE — Telephone Encounter (Signed)
Ronisha from [facility] was calling in regards to the surgical clearance. Their office does not have a standard form, but just asks that we send a letter on [facility] letterhead documenting  clearance     [facility] Pre-operative Risk Assessment    Request for surgical clearance:  1. What type of surgery is being performed? Open Pectoralis Repair   2. When is this surgery scheduled? Friday 11-02-18  3. What type of clearance is required (medical clearance vs. Pharmacy clearance to hold med vs. Both)? both  4. Are there any medications that need to be held prior to surgery and how long? unsure  5. Practice name and name of physician performing surgery? Dr. Douglass Rivers at [facility]   6. What is your office phone number:   Direct: [phone]  Main office: [phone]   7.   What is your office fax number: [phone]  8.   Anesthesia type (None, local, MAC, general) ? general   Johnna Acosta 10/29/2018, 2:39 PM  _________________________________________________________________   (provider comments below)

## 2018-11-02 DIAGNOSIS — G8918 Other acute postprocedural pain: Secondary | ICD-10-CM | POA: Diagnosis not present

## 2018-11-02 DIAGNOSIS — S29011A Strain of muscle and tendon of front wall of thorax, initial encounter: Secondary | ICD-10-CM | POA: Diagnosis not present

## 2018-11-22 DIAGNOSIS — S29011A Strain of muscle and tendon of front wall of thorax, initial encounter: Secondary | ICD-10-CM | POA: Diagnosis not present

## 2018-12-03 DIAGNOSIS — S29011A Strain of muscle and tendon of front wall of thorax, initial encounter: Secondary | ICD-10-CM | POA: Diagnosis not present

## 2018-12-11 DIAGNOSIS — S29011A Strain of muscle and tendon of front wall of thorax, initial encounter: Secondary | ICD-10-CM | POA: Diagnosis not present

## 2018-12-17 DIAGNOSIS — S29011A Strain of muscle and tendon of front wall of thorax, initial encounter: Secondary | ICD-10-CM | POA: Diagnosis not present

## 2018-12-19 DIAGNOSIS — S29011A Strain of muscle and tendon of front wall of thorax, initial encounter: Secondary | ICD-10-CM | POA: Diagnosis not present

## 2018-12-20 ENCOUNTER — Other Ambulatory Visit: Payer: Self-pay | Admitting: Cardiology

## 2018-12-20 NOTE — Telephone Encounter (Signed)
Rx request sent to pharmacy.  

## 2018-12-24 DIAGNOSIS — S29011A Strain of muscle and tendon of front wall of thorax, initial encounter: Secondary | ICD-10-CM | POA: Diagnosis not present

## 2018-12-26 DIAGNOSIS — S29011A Strain of muscle and tendon of front wall of thorax, initial encounter: Secondary | ICD-10-CM | POA: Diagnosis not present

## 2019-01-01 DIAGNOSIS — S29011A Strain of muscle and tendon of front wall of thorax, initial encounter: Secondary | ICD-10-CM | POA: Diagnosis not present

## 2019-01-03 DIAGNOSIS — S29011A Strain of muscle and tendon of front wall of thorax, initial encounter: Secondary | ICD-10-CM | POA: Diagnosis not present

## 2019-01-04 HISTORY — PX: SHOULDER ARTHROSCOPY: SHX128

## 2019-01-09 DIAGNOSIS — S29011A Strain of muscle and tendon of front wall of thorax, initial encounter: Secondary | ICD-10-CM | POA: Diagnosis not present

## 2019-01-11 DIAGNOSIS — S29011A Strain of muscle and tendon of front wall of thorax, initial encounter: Secondary | ICD-10-CM | POA: Diagnosis not present

## 2019-01-15 DIAGNOSIS — S29011A Strain of muscle and tendon of front wall of thorax, initial encounter: Secondary | ICD-10-CM | POA: Diagnosis not present

## 2019-01-18 DIAGNOSIS — S29011A Strain of muscle and tendon of front wall of thorax, initial encounter: Secondary | ICD-10-CM | POA: Diagnosis not present

## 2019-01-21 DIAGNOSIS — S29011A Strain of muscle and tendon of front wall of thorax, initial encounter: Secondary | ICD-10-CM | POA: Diagnosis not present

## 2019-01-23 DIAGNOSIS — S29011A Strain of muscle and tendon of front wall of thorax, initial encounter: Secondary | ICD-10-CM | POA: Diagnosis not present

## 2019-01-30 DIAGNOSIS — S29011A Strain of muscle and tendon of front wall of thorax, initial encounter: Secondary | ICD-10-CM | POA: Diagnosis not present

## 2019-02-04 DIAGNOSIS — S29011A Strain of muscle and tendon of front wall of thorax, initial encounter: Secondary | ICD-10-CM | POA: Diagnosis not present

## 2019-02-07 DIAGNOSIS — S29011A Strain of muscle and tendon of front wall of thorax, initial encounter: Secondary | ICD-10-CM | POA: Diagnosis not present

## 2019-02-14 DIAGNOSIS — S29011A Strain of muscle and tendon of front wall of thorax, initial encounter: Secondary | ICD-10-CM | POA: Diagnosis not present

## 2019-02-19 DIAGNOSIS — S29011A Strain of muscle and tendon of front wall of thorax, initial encounter: Secondary | ICD-10-CM | POA: Diagnosis not present

## 2019-02-26 DIAGNOSIS — S29011A Strain of muscle and tendon of front wall of thorax, initial encounter: Secondary | ICD-10-CM | POA: Diagnosis not present

## 2019-03-01 DIAGNOSIS — S29011A Strain of muscle and tendon of front wall of thorax, initial encounter: Secondary | ICD-10-CM | POA: Diagnosis not present

## 2019-03-05 DIAGNOSIS — S29011A Strain of muscle and tendon of front wall of thorax, initial encounter: Secondary | ICD-10-CM | POA: Diagnosis not present

## 2019-03-08 DIAGNOSIS — S29011A Strain of muscle and tendon of front wall of thorax, initial encounter: Secondary | ICD-10-CM | POA: Diagnosis not present

## 2019-03-08 DIAGNOSIS — K219 Gastro-esophageal reflux disease without esophagitis: Secondary | ICD-10-CM | POA: Diagnosis not present

## 2019-03-08 DIAGNOSIS — R140 Abdominal distension (gaseous): Secondary | ICD-10-CM | POA: Diagnosis not present

## 2019-03-15 ENCOUNTER — Other Ambulatory Visit: Payer: Self-pay | Admitting: Cardiology

## 2019-03-28 DIAGNOSIS — S29011A Strain of muscle and tendon of front wall of thorax, initial encounter: Secondary | ICD-10-CM | POA: Diagnosis not present

## 2019-04-13 ENCOUNTER — Other Ambulatory Visit: Payer: Self-pay | Admitting: Cardiology

## 2019-05-02 NOTE — Progress Notes (Signed)
HPI: FU atrial fibrillation. Patient had probable atrial fibrillation in January of 2013 for approximately 5 minutes. Recurrent episode in April 2013 requiring visit to ER. Patient was seen at [facility] for his atrial fibrillation. An echocardiogram showed normal LV function. The right ventricle was borderline dilated. There was mild mitral regurgitation. The patient was given a prescription for flecainide to be used as needed. Apparently he had a TSH checked by his primary care physician which was normal. Did not tolerate toprol previously;cardizem changed to atenolol at previous ov due to worse GERD;preferred ASA to NOAC. Since I last saw him,patient had one episode of atrial fibrillation in October for 3 hours.  He took an older prescription of flecainide and converted.  Otherwise no dyspnea on exertion, orthopnea, PND, syncope or exertional chest pain.  Occasional flutters.  Current Outpatient Medications  Medication Sig Dispense Refill  . atenolol (TENORMIN) 25 MG tablet TAKE 1 TABLET (25 MG TOTAL) BY MOUTH DAILY. (BETA BLOCKER) PLEASE SCHEDULE APPOINTMENT FOR REFILLS. 30 tablet 0   No current facility-administered medications for this visit.     Past Medical History:  Diagnosis Date  . Allergy   . Anxiety   . Atrial fibrillation   . Eosinophilic esophagitis   . GERD (gastroesophageal reflux disease)   . Hypertension   . IBS (irritable bowel syndrome)   . Migraines   . Schatzki's ring     Past Surgical History:  Procedure Laterality Date  . ADENOIDECTOMY    . ARTHROSCOPIC REPAIR ACL    . Left knee surgery     ACL  . MYRINGOTOMY      Social History   Socioeconomic History  . Marital status: Married    Spouse name: Not on file  . Number of children: 2  . Years of education: Not on file  . Highest education level: Not on file  Occupational History    Employer: [employer]    Comment: [employer]  Tobacco Use  . Smoking status: Never Smoker  . Smokeless tobacco:  Never Used  Substance and Sexual Activity  . Alcohol use: Yes    Alcohol/week: 2.0 standard drinks    Types: 2 Glasses of wine per week    Comment: Glass of wine every other night  . Drug use: No  . Sexual activity: Yes    Partners: Female  Other Topics Concern  . Not on file  Social History Narrative  . Not on file   Social Determinants of Health   Financial Resource Strain:   . Difficulty of Paying Living Expenses:   Food Insecurity:   . Worried About Charity fundraiser in the Last Year:   . Arboriculturist in the Last Year:   Transportation Needs:   . Film/video editor (Medical):   Marland Kitchen Lack of Transportation (Non-Medical):   Physical Activity:   . Days of Exercise per Week:   . Minutes of Exercise per Session:   Stress:   . Feeling of Stress :   Social Connections:   . Frequency of Communication with Friends and Family:   . Frequency of Social Gatherings with Friends and Family:   . Attends Religious Services:   . Active Member of Clubs or Organizations:   . Attends Archivist Meetings:   Marland Kitchen Marital Status:   Intimate Partner Violence:   . Fear of Current or Ex-Partner:   . Emotionally Abused:   Marland Kitchen Physically Abused:   . Sexually Abused:  Family History  Problem Relation Age of Onset  . Heart disease Father        Cardiomyopathy  . GER disease Father   . Anxiety disorder Mother   . GER disease Mother   . Hypertension Mother   . Colon cancer Neg Hx   . Stomach cancer Neg Hx   . Esophageal cancer Neg Hx   . Rectal cancer Neg Hx     ROS: no fevers or chills, productive cough, hemoptysis, dysphasia, odynophagia, melena, hematochezia, dysuria, hematuria, rash, seizure activity, orthopnea, PND, pedal edema, claudication. Remaining systems are negative.  Physical Exam: Well-developed well-nourished in no acute distress.  Skin is warm and dry.  HEENT is normal.  Neck is supple.  Chest is clear to auscultation with normal expansion.   Cardiovascular exam is regular rate and rhythm.  Abdominal exam nontender or distended. No masses palpated. Extremities show no edema. neuro grossly intact  ECG-normal sinus rhythm at a rate of 71, normal axis, no ST changes.  Personally reviewed  A/P  1 paroxysmal atrial fibrillation-patient is in sinus rhythm today.  He has had 1 short episode of atrial fibrillation in October by his report.  It resolved with flecainide.  We will continue with pill in the pocket strategy for now.  We can consider addition of running antiarrhythmic in the future if he has more frequent episodes versus referral for ablation.  Continue atenolol.  He declines anticoagulation and understands the risk of CVA.  Continue aspirin.  2 hypertension-blood pressure controlled.  Continue present medications.  3 Gastroesophageal reflux disease-Per primary care.  Kirk Ruths, MD

## 2019-05-06 ENCOUNTER — Other Ambulatory Visit: Payer: Self-pay

## 2019-05-06 ENCOUNTER — Ambulatory Visit: Payer: BC Managed Care – PPO | Admitting: Cardiology

## 2019-05-06 ENCOUNTER — Encounter: Payer: Self-pay | Admitting: Cardiology

## 2019-05-06 VITALS — BP 130/88 | HR 71 | Temp 97.3°F | Ht 70.0 in | Wt 191.0 lb

## 2019-05-06 DIAGNOSIS — I480 Paroxysmal atrial fibrillation: Secondary | ICD-10-CM | POA: Diagnosis not present

## 2019-05-06 DIAGNOSIS — I10 Essential (primary) hypertension: Secondary | ICD-10-CM

## 2019-05-06 MED ORDER — FLECAINIDE ACETATE 150 MG PO TABS: ORAL_TABLET | ORAL | 12 refills | Status: DC

## 2019-05-06 NOTE — Patient Instructions (Signed)

## 2019-05-16 ENCOUNTER — Other Ambulatory Visit: Payer: Self-pay | Admitting: Cardiology

## 2019-08-19 ENCOUNTER — Other Ambulatory Visit: Payer: Self-pay | Admitting: Cardiology

## 2019-08-22 DIAGNOSIS — M542 Cervicalgia: Secondary | ICD-10-CM | POA: Diagnosis not present

## 2019-08-26 DIAGNOSIS — M542 Cervicalgia: Secondary | ICD-10-CM | POA: Diagnosis not present

## 2019-08-28 DIAGNOSIS — M542 Cervicalgia: Secondary | ICD-10-CM | POA: Diagnosis not present

## 2019-09-02 DIAGNOSIS — M542 Cervicalgia: Secondary | ICD-10-CM | POA: Diagnosis not present

## 2019-09-05 DIAGNOSIS — M542 Cervicalgia: Secondary | ICD-10-CM | POA: Diagnosis not present

## 2019-09-10 DIAGNOSIS — M542 Cervicalgia: Secondary | ICD-10-CM | POA: Diagnosis not present

## 2019-12-02 DIAGNOSIS — Z125 Encounter for screening for malignant neoplasm of prostate: Secondary | ICD-10-CM | POA: Diagnosis not present

## 2019-12-02 DIAGNOSIS — Z0000 Encounter for general adult medical examination without abnormal findings: Secondary | ICD-10-CM | POA: Diagnosis not present

## 2019-12-02 DIAGNOSIS — Z13220 Encounter for screening for lipoid disorders: Secondary | ICD-10-CM | POA: Diagnosis not present

## 2019-12-04 DIAGNOSIS — H6123 Impacted cerumen, bilateral: Secondary | ICD-10-CM | POA: Diagnosis not present

## 2019-12-04 DIAGNOSIS — Z0000 Encounter for general adult medical examination without abnormal findings: Secondary | ICD-10-CM | POA: Diagnosis not present

## 2019-12-04 DIAGNOSIS — Z23 Encounter for immunization: Secondary | ICD-10-CM | POA: Diagnosis not present

## 2020-01-09 DIAGNOSIS — D485 Neoplasm of uncertain behavior of skin: Secondary | ICD-10-CM | POA: Diagnosis not present

## 2020-01-09 DIAGNOSIS — L578 Other skin changes due to chronic exposure to nonionizing radiation: Secondary | ICD-10-CM | POA: Diagnosis not present

## 2020-01-09 DIAGNOSIS — Z86018 Personal history of other benign neoplasm: Secondary | ICD-10-CM | POA: Diagnosis not present

## 2020-01-09 DIAGNOSIS — L814 Other melanin hyperpigmentation: Secondary | ICD-10-CM | POA: Diagnosis not present

## 2020-01-09 DIAGNOSIS — D225 Melanocytic nevi of trunk: Secondary | ICD-10-CM | POA: Diagnosis not present

## 2020-01-30 NOTE — Progress Notes (Unsigned)
Virtual Visit via Video Note   This visit type was conducted due to national recommendations for restrictions regarding the COVID-19 Pandemic (e.g. social distancing) in an effort to limit this patient's exposure and mitigate transmission in our community.  Due to his co-morbid illnesses, this patient is at least at moderate risk for complications without adequate follow up.  This format is felt to be most appropriate for this patient at this time.  All issues noted in this document were discussed and addressed.  A limited physical exam was performed with this format.  Please refer to the patient's chart for his consent to telehealth for [facility].      Date:  01/31/2020   ID:  Jim Gonzalez, DOB [birth year], MRN [id number]  Patient Location:Home Provider Location: Home  PCP:  Hulan Fess, MD  Cardiologist:  Dr Stanford Breed  Evaluation Performed:  Follow-Up Visit  Chief Complaint:  FU atrial fibrillation   History of Present Illness:    FU atrial fibrillation. Patient had probable atrial fibrillation in January of 2013 for approximately 5 minutes. Recurrent episode in April 2013 requiring visit to ER. Patient was seen at [facility] for his atrial fibrillation. An echocardiogram showed normal LV function. The right ventricle was borderline dilated. There was mild mitral regurgitation. The patient was given a prescription for flecainide to be used as needed. Apparently he had a TSH checked by his primary care physician which was normal. Did not tolerate toprol previously;cardizem changed to atenolol atpreviousov due to worse GERD;preferred ASA to NOAC. Since I last saw him,he denies dyspnea, CP or syncope. Has had episode of increased palpitations with excitement and alcohol.   The patient does not have symptoms concerning for COVID-19 infection (fever, chills, cough, or new shortness of breath).    Past Medical History:  Diagnosis Date  . Allergy   . Anxiety   . Atrial  fibrillation   . Eosinophilic esophagitis   . GERD (gastroesophageal reflux disease)   . Hypertension   . IBS (irritable bowel syndrome)   . Migraines   . Schatzki's ring    Past Surgical History:  Procedure Laterality Date  . ADENOIDECTOMY    . ARTHROSCOPIC REPAIR ACL    . Left knee surgery     ACL  . MYRINGOTOMY       Current Meds  Medication Sig  . atenolol (TENORMIN) 25 MG tablet TAKE 1 TABLET (25 MG TOTAL) BY MOUTH DAILY. (BETA BLOCKER) PLEASE SCHEDULE APPOINTMENT FOR REFILLS.  . flecainide (TAMBOCOR) 150 MG tablet Take 2 tablets at onset of atrial fib, if persist take 2 more tablets, call if continues after the 2nd dose  . omeprazole (PRILOSEC) 40 MG capsule Take 40 mg by mouth daily.     Allergies:   Penicillins   Social History   Tobacco Use  . Smoking status: Never Smoker  . Smokeless tobacco: Never Used  Vaping Use  . Vaping Use: Never used  Substance Use Topics  . Alcohol use: Yes    Alcohol/week: 2.0 standard drinks    Types: 2 Glasses of wine per week    Comment: Glass of wine every other night  . Drug use: No     Family Hx: The patient's family history includes Anxiety disorder in his mother; GER disease in his father and mother; Heart disease in his father; Hypertension in his mother. There is no history of Colon cancer, Stomach cancer, Esophageal cancer, or Rectal cancer.  ROS:   Please see the history of  present illness.    No Fever, chills  or productive cough All other systems reviewed and are negative.  Wt Readings from Last 3 Encounters:  01/31/20 180 lb (81.6 kg)  05/06/19 191 lb (86.6 kg)  02/02/18 181 lb 6 oz (82.3 kg)     Objective:    Vital Signs:  Ht 5' 9.5" (1.765 m)   Wt 180 lb (81.6 kg)   BMI 26.20 kg/m    VITAL SIGNS:  reviewed NAD Answers questions appropriately Normal affect Remainder of physical examination not performed (telehealth visit; coronavirus pandemic)  ASSESSMENT & PLAN:    1. Paroxysmal atrial  fibrillation-pt with increased frequency of palpitations; has cardiomobile; will send strips; increase atenolol to 50 mg daily; if he is having recurrent atrial fibrillation, will add daily dose of flecanide; he would prefer to continue ASA and not anticoagulation; if he is having recurrent atrial fibrillation will discuss further; repeat echo.  2. Hypertension-Continue present medications and follow. 3. Gastroesophageal reflux disease-managed by primary care.  COVID-19 Education: The importance of social distancing was discussed today.  Time:   Today, I have spent 16 minutes with the patient with telehealth technology discussing the above problems.     Medication Adjustments/Labs and Tests Ordered: Current medicines are reviewed at length with the patient today.  Concerns regarding medicines are outlined above.   Tests Ordered: No orders of the defined types were placed in this encounter.   Medication Changes: No orders of the defined types were placed in this encounter.   Follow Up:  In Person in 6 month(s)  Signed, Kirk Ruths, MD  01/31/2020 7:55 AM    [place]

## 2020-01-31 ENCOUNTER — Telehealth (INDEPENDENT_AMBULATORY_CARE_PROVIDER_SITE_OTHER): Payer: BC Managed Care – PPO | Admitting: Cardiology

## 2020-01-31 ENCOUNTER — Encounter: Payer: Self-pay | Admitting: Cardiology

## 2020-01-31 VITALS — Ht 69.5 in | Wt 180.0 lb

## 2020-01-31 DIAGNOSIS — I10 Essential (primary) hypertension: Secondary | ICD-10-CM

## 2020-01-31 DIAGNOSIS — I480 Paroxysmal atrial fibrillation: Secondary | ICD-10-CM

## 2020-01-31 DIAGNOSIS — R6881 Early satiety: Secondary | ICD-10-CM | POA: Diagnosis not present

## 2020-01-31 DIAGNOSIS — K219 Gastro-esophageal reflux disease without esophagitis: Secondary | ICD-10-CM | POA: Diagnosis not present

## 2020-01-31 DIAGNOSIS — R140 Abdominal distension (gaseous): Secondary | ICD-10-CM | POA: Diagnosis not present

## 2020-01-31 MED ORDER — ATENOLOL 50 MG PO TABS: 50.0000 mg | ORAL_TABLET | Freq: Every day | ORAL | 3 refills | Status: DC

## 2020-01-31 MED ORDER — ASPIRIN EC 81 MG PO TBEC: 81.0000 mg | DELAYED_RELEASE_TABLET | Freq: Every day | ORAL | 3 refills | Status: AC

## 2020-01-31 NOTE — Patient Instructions (Signed)
Medication Instructions:   INCREASE ATENOLOL TO 50 MG ONCE DAILY=2 OF THE 25 MG TABLETS ONCE DAILY  *If you need a refill on your cardiac medications before your next appointment, please call your pharmacy*  Testing/Procedures:  Your physician has requested that you have an echocardiogram. Echocardiography is a painless test that uses sound waves to create images of your heart. It provides your doctor with information about the size and shape of your heart and how well your heart's chambers and valves are working. This procedure takes approximately one hour. There are no restrictions for this procedure.[place]    Follow-Up: At [facility], you and your health needs are our priority.  As part of our continuing mission to provide you with exceptional heart care, we have created designated Provider Care Teams.  These Care Teams include your primary Cardiologist (physician) and Advanced Practice Providers (APPs -  Physician Assistants and Nurse Practitioners) who all work together to provide you with the care you need, when you need it.  We recommend signing up for the patient portal called "MyChart".  Sign up information is provided on this After Visit Summary.  MyChart is used to connect with patients for Virtual Visits (Telemedicine).  Patients are able to view lab/test results, encounter notes, upcoming appointments, etc.  Non-urgent messages can be sent to your provider as well.   To learn more about what you can do with MyChart, go to [email or web address].    Your next appointment:   3 month(s)  The format for your next appointment:   In Person  Provider:   Kirk Ruths, MD

## 2020-02-20 DIAGNOSIS — I480 Paroxysmal atrial fibrillation: Secondary | ICD-10-CM

## 2020-02-20 MED ORDER — ATENOLOL 25 MG PO TABS: 25.0000 mg | ORAL_TABLET | Freq: Every day | ORAL | 3 refills | Status: DC

## 2020-02-21 ENCOUNTER — Other Ambulatory Visit (HOSPITAL_COMMUNITY): Payer: BC Managed Care – PPO

## 2020-03-20 ENCOUNTER — Other Ambulatory Visit: Payer: Self-pay

## 2020-03-20 ENCOUNTER — Ambulatory Visit (HOSPITAL_COMMUNITY): Payer: BC Managed Care – PPO | Attending: Internal Medicine

## 2020-03-20 DIAGNOSIS — I480 Paroxysmal atrial fibrillation: Secondary | ICD-10-CM | POA: Insufficient documentation

## 2020-03-20 LAB — ECHOCARDIOGRAM COMPLETE
Area-P 1/2: 3.42 cm2
S' Lateral: 2.8 cm

## 2020-03-24 ENCOUNTER — Encounter: Payer: Self-pay | Admitting: *Deleted

## 2020-04-23 NOTE — Progress Notes (Signed)
HPI: FU atrial fibrillation. Patient had probable atrial fibrillation in January of 2013 for approximately 5 minutes. Recurrent episode in April 2013 requiring visit to ER. Patient was seen at [facility] for his atrial fibrillation. The patient was given a prescription for flecainide to be used as needed. Did not tolerate toprol previously;cardizem changed to atenolol atpreviousov due to worse GERD;preferred ASA to NOAC.  Echocardiogram March 2022 showed normal LV function.  Since I last saw him,he has occasional skips but no sustained atrial fibrillation.  Denies dyspnea, chest pain or syncope.  Current Outpatient Medications  Medication Sig Dispense Refill  . aspirin EC 81 MG tablet Take 1 tablet (81 mg total) by mouth daily. Swallow whole. 90 tablet 3  . atenolol (TENORMIN) 25 MG tablet Take 1 tablet (25 mg total) by mouth daily. 90 tablet 3  . flecainide (TAMBOCOR) 150 MG tablet Take 2 tablets at onset of atrial fib, if persist take 2 more tablets, call if continues after the 2nd dose 4 tablet 12  . omeprazole (PRILOSEC) 40 MG capsule Take 20 mg by mouth daily.     No current facility-administered medications for this visit.     Past Medical History:  Diagnosis Date  . Allergy   . Anxiety   . Atrial fibrillation   . Eosinophilic esophagitis   . GERD (gastroesophageal reflux disease)   . Hypertension   . IBS (irritable bowel syndrome)   . Migraines   . Schatzki's ring     Past Surgical History:  Procedure Laterality Date  . ADENOIDECTOMY    . ARTHROSCOPIC REPAIR ACL    . Left knee surgery     ACL  . MYRINGOTOMY      Social History   Socioeconomic History  . Marital status: Married    Spouse name: Not on file  . Number of children: 2  . Years of education: Not on file  . Highest education level: Not on file  Occupational History    Employer: [employer]    Comment: [employer]  Tobacco Use  . Smoking status: Never Smoker  . Smokeless tobacco: Never Used   Vaping Use  . Vaping Use: Never used  Substance and Sexual Activity  . Alcohol use: Yes    Alcohol/week: 2.0 standard drinks    Types: 2 Glasses of wine per week    Comment: Glass of wine every other night  . Drug use: No  . Sexual activity: Yes    Partners: Female  Other Topics Concern  . Not on file  Social History Narrative  . Not on file   Social Determinants of Health   Financial Resource Strain: Not on file  Food Insecurity: Not on file  Transportation Needs: Not on file  Physical Activity: Not on file  Stress: Not on file  Social Connections: Not on file  Intimate Partner Violence: Not on file    Family History  Problem Relation Age of Onset  . Heart disease Father        Cardiomyopathy  . GER disease Father   . Anxiety disorder Mother   . GER disease Mother   . Hypertension Mother   . Colon cancer Neg Hx   . Stomach cancer Neg Hx   . Esophageal cancer Neg Hx   . Rectal cancer Neg Hx     ROS: no fevers or chills, productive cough, hemoptysis, dysphasia, odynophagia, melena, hematochezia, dysuria, hematuria, rash, seizure activity, orthopnea, PND, pedal edema, claudication. Remaining systems are negative.  Physical Exam: Well-developed well-nourished in no acute distress.  Skin is warm and dry.  HEENT is normal.  Neck is supple.  Chest is clear to auscultation with normal expansion.  Cardiovascular exam is regular rate and rhythm.  Abdominal exam nontender or distended. No masses palpated. Extremities show no edema. neuro grossly intact  ECG-sinus bradycardia at a rate of 59, normal axis, no ST changes.  Personally reviewed  A/P  1 paroxysmal atrial fibrillation-no recurrences since last office visit.  Continue beta-blocker at present dose.  He prefers aspirin to DOAC and CHA2DS2-VASc is 1 for hypertension.  We will consider addition of antiarrhythmic in the future versus referral for ablation if he has more frequent episodes.  2 hypertension-blood  pressure controlled.  Continue present medications.  Kirk Ruths, MD

## 2020-05-01 ENCOUNTER — Other Ambulatory Visit: Payer: Self-pay

## 2020-05-01 ENCOUNTER — Encounter: Payer: Self-pay | Admitting: Cardiology

## 2020-05-01 ENCOUNTER — Ambulatory Visit: Payer: BC Managed Care – PPO | Admitting: Cardiology

## 2020-05-01 VITALS — BP 140/84 | HR 59 | Ht 70.0 in | Wt 179.6 lb

## 2020-05-01 DIAGNOSIS — I480 Paroxysmal atrial fibrillation: Secondary | ICD-10-CM | POA: Diagnosis not present

## 2020-05-01 DIAGNOSIS — I10 Essential (primary) hypertension: Secondary | ICD-10-CM

## 2020-05-01 NOTE — Patient Instructions (Signed)

## 2020-05-12 DIAGNOSIS — Z3009 Encounter for other general counseling and advice on contraception: Secondary | ICD-10-CM | POA: Diagnosis not present

## 2020-06-08 DIAGNOSIS — Z302 Encounter for sterilization: Secondary | ICD-10-CM | POA: Diagnosis not present

## 2020-06-12 DIAGNOSIS — H109 Unspecified conjunctivitis: Secondary | ICD-10-CM | POA: Diagnosis not present

## 2020-07-13 DIAGNOSIS — L814 Other melanin hyperpigmentation: Secondary | ICD-10-CM | POA: Diagnosis not present

## 2020-07-13 DIAGNOSIS — L578 Other skin changes due to chronic exposure to nonionizing radiation: Secondary | ICD-10-CM | POA: Diagnosis not present

## 2020-07-13 DIAGNOSIS — D225 Melanocytic nevi of trunk: Secondary | ICD-10-CM | POA: Diagnosis not present

## 2020-07-13 DIAGNOSIS — Z86018 Personal history of other benign neoplasm: Secondary | ICD-10-CM | POA: Diagnosis not present

## 2020-11-30 ENCOUNTER — Encounter: Payer: Self-pay | Admitting: Cardiology

## 2020-12-01 ENCOUNTER — Encounter: Payer: Self-pay | Admitting: Cardiology

## 2020-12-01 ENCOUNTER — Ambulatory Visit (INDEPENDENT_AMBULATORY_CARE_PROVIDER_SITE_OTHER): Payer: BC Managed Care – PPO | Admitting: Cardiology

## 2020-12-01 ENCOUNTER — Other Ambulatory Visit: Payer: Self-pay

## 2020-12-01 VITALS — BP 124/92 | HR 64 | Ht 70.0 in | Wt 192.4 lb

## 2020-12-01 DIAGNOSIS — I480 Paroxysmal atrial fibrillation: Secondary | ICD-10-CM | POA: Diagnosis not present

## 2020-12-01 DIAGNOSIS — R002 Palpitations: Secondary | ICD-10-CM | POA: Diagnosis not present

## 2020-12-01 DIAGNOSIS — I10 Essential (primary) hypertension: Secondary | ICD-10-CM

## 2020-12-01 MED ORDER — ATENOLOL 25 MG PO TABS: ORAL_TABLET | ORAL | 3 refills | Status: DC

## 2020-12-01 NOTE — Progress Notes (Signed)
HPI: FU atrial fibrillation. Patient had probable atrial fibrillation in January of 2013 for approximately 5 minutes. Recurrent episode in April 2013 requiring visit to ER. Patient was seen at [facility] for his atrial fibrillation. The patient was given a prescription for flecainide to be used as needed. Did not tolerate toprol previously; cardizem changed to atenolol at previous ov due to worse GERD; preferred ASA to NOAC. Echocardiogram March 2022 showed normal LV function.  Patient contacted the office with increased palpitations and was added to my schedule today.  He recorded a rhythm strip showing sinus rhythm with PACs.  Since I last saw him, he is having increased frequency of PACs.  He is symptomatic describing these as a skipped.  It is not similar to his atrial fibrillation.  He otherwise denies dyspnea, chest pain or syncope.  Current Outpatient Medications  Medication Sig Dispense Refill   aspirin EC 81 MG tablet Take 1 tablet (81 mg total) by mouth daily. Swallow whole. 90 tablet 3   omeprazole (PRILOSEC) 40 MG capsule Take 20 mg by mouth daily.     atenolol (TENORMIN) 25 MG tablet 1 tablet daily and may take an extra tablet as needed for palpitations. 180 tablet 3   flecainide (TAMBOCOR) 150 MG tablet Take 2 tablets at onset of atrial fib, if persist take 2 more tablets, call if continues after the 2nd dose (Patient not taking: Reported on 12/01/2020) 4 tablet 12   No current facility-administered medications for this visit.     Past Medical History:  Diagnosis Date   Allergy    Anxiety    Atrial fibrillation    Eosinophilic esophagitis    GERD (gastroesophageal reflux disease)    Hypertension    IBS (irritable bowel syndrome)    Migraines    Schatzki's ring     Past Surgical History:  Procedure Laterality Date   ADENOIDECTOMY     ARTHROSCOPIC REPAIR ACL     Left knee surgery     ACL   MYRINGOTOMY      Social History   Socioeconomic History    Marital status: Married    Spouse name: Not on file   Number of children: 2   Years of education: Not on file   Highest education level: Not on file  Occupational History    Employer: [employer]    Comment: [employer]  Tobacco Use   Smoking status: Never   Smokeless tobacco: Never  Vaping Use   Vaping Use: Never used  Substance and Sexual Activity   Alcohol use: Yes    Alcohol/week: 2.0 standard drinks    Types: 2 Glasses of wine per week    Comment: Glass of wine every other night   Drug use: No   Sexual activity: Yes    Partners: Female  Other Topics Concern   Not on file  Social History Narrative   Not on file   Social Determinants of Health   Financial Resource Strain: Not on file  Food Insecurity: Not on file  Transportation Needs: Not on file  Physical Activity: Not on file  Stress: Not on file  Social Connections: Not on file  Intimate Partner Violence: Not on file    Family History  Problem Relation Age of Onset   Heart disease Father        Cardiomyopathy   GER disease Father    Anxiety disorder Mother    GER disease Mother    Hypertension Mother  Colon cancer Neg Hx    Stomach cancer Neg Hx    Esophageal cancer Neg Hx    Rectal cancer Neg Hx     ROS: no fevers or chills, productive cough, hemoptysis, dysphasia, odynophagia, melena, hematochezia, dysuria, hematuria, rash, seizure activity, orthopnea, PND, pedal edema, claudication. Remaining systems are negative.  Physical Exam: Well-developed well-nourished in no acute distress.  Skin is warm and dry.  HEENT is normal.  Neck is supple.  Chest is clear to auscultation with normal expansion.  Cardiovascular exam is regular rate and rhythm.  Abdominal exam nontender or distended. No masses palpated. Extremities show no edema. neuro grossly intact  ECG-normal sinus rhythm at a rate of 64, no ST changes.  Personally reviewed  A/P  1 paroxysmal atrial fibrillation-patient remains in sinus rhythm.  We will  continue atenolol for rate control if atrial fibrillation recurs.  Continue flecainide as needed.  He has previously elected to take aspirin instead of his NOAC.  2 hypertension-patient's blood pressure is borderline; will follow and increase medications as needed.  Kirk Ruths, MD

## 2020-12-01 NOTE — Patient Instructions (Signed)
Medication Instructions:   MAY TAKE EXTRA ATENOLOL 25 MG DAILY AS NEEDED FOR PALPITATIONS  *If you need a refill on your cardiac medications before your next appointment, please call your pharmacy*   Follow-Up: At [facility], you and your health needs are our priority.  As part of our continuing mission to provide you with exceptional heart care, we have created designated Provider Care Teams.  These Care Teams include your primary Cardiologist (physician) and Advanced Practice Providers (APPs -  Physician Assistants and Nurse Practitioners) who all work together to provide you with the care you need, when you need it.  We recommend signing up for the patient portal called "MyChart".  Sign up information is provided on this After Visit Summary.  MyChart is used to connect with patients for Virtual Visits (Telemedicine).  Patients are able to view lab/test results, encounter notes, upcoming appointments, etc.  Non-urgent messages can be sent to your provider as well.   To learn more about what you can do with MyChart, go to [email or web address].    Your next appointment:   6 month(s)  The format for your next appointment:   In Person  Provider:   Kirk Ruths, MD

## 2020-12-10 DIAGNOSIS — M542 Cervicalgia: Secondary | ICD-10-CM | POA: Diagnosis not present

## 2020-12-24 DIAGNOSIS — M5412 Radiculopathy, cervical region: Secondary | ICD-10-CM | POA: Diagnosis not present

## 2021-01-01 DIAGNOSIS — M542 Cervicalgia: Secondary | ICD-10-CM | POA: Diagnosis not present

## 2021-01-19 ENCOUNTER — Other Ambulatory Visit: Payer: Self-pay | Admitting: Dermatology

## 2021-01-19 DIAGNOSIS — D225 Melanocytic nevi of trunk: Secondary | ICD-10-CM | POA: Diagnosis not present

## 2021-01-19 DIAGNOSIS — L573 Poikiloderma of Civatte: Secondary | ICD-10-CM | POA: Diagnosis not present

## 2021-01-19 DIAGNOSIS — L578 Other skin changes due to chronic exposure to nonionizing radiation: Secondary | ICD-10-CM | POA: Diagnosis not present

## 2021-01-19 DIAGNOSIS — D485 Neoplasm of uncertain behavior of skin: Secondary | ICD-10-CM

## 2021-01-21 ENCOUNTER — Other Ambulatory Visit: Payer: BC Managed Care – PPO

## 2021-01-28 ENCOUNTER — Ambulatory Visit
Admission: RE | Admit: 2021-01-28 | Discharge: 2021-01-28 | Disposition: A | Discharge status: Home / Self Care | Payer: BC Managed Care – PPO | Source: Ambulatory Visit | Attending: Dermatology | Admitting: Dermatology

## 2021-01-28 DIAGNOSIS — D485 Neoplasm of uncertain behavior of skin: Secondary | ICD-10-CM

## 2021-02-17 DIAGNOSIS — Z0000 Encounter for general adult medical examination without abnormal findings: Secondary | ICD-10-CM | POA: Diagnosis not present

## 2021-02-17 DIAGNOSIS — I10 Essential (primary) hypertension: Secondary | ICD-10-CM | POA: Diagnosis not present

## 2021-06-22 DIAGNOSIS — J029 Acute pharyngitis, unspecified: Secondary | ICD-10-CM | POA: Diagnosis not present

## 2021-07-20 DIAGNOSIS — L814 Other melanin hyperpigmentation: Secondary | ICD-10-CM | POA: Diagnosis not present

## 2021-07-20 DIAGNOSIS — D485 Neoplasm of uncertain behavior of skin: Secondary | ICD-10-CM | POA: Diagnosis not present

## 2021-07-20 DIAGNOSIS — D2272 Melanocytic nevi of left lower limb, including hip: Secondary | ICD-10-CM | POA: Diagnosis not present

## 2021-07-20 DIAGNOSIS — D229 Melanocytic nevi, unspecified: Secondary | ICD-10-CM | POA: Diagnosis not present

## 2021-07-20 DIAGNOSIS — L578 Other skin changes due to chronic exposure to nonionizing radiation: Secondary | ICD-10-CM | POA: Diagnosis not present

## 2021-07-20 DIAGNOSIS — B351 Tinea unguium: Secondary | ICD-10-CM | POA: Diagnosis not present

## 2021-07-26 ENCOUNTER — Telehealth: Payer: Self-pay | Admitting: Gastroenterology

## 2021-07-26 NOTE — Telephone Encounter (Addendum)
Hi Dr. Loletha Carrow,   We received a referral for patient to have a colonoscopy. The patient was previous your patient back in 2018. The patient is requesting a transfer of care over back to you. Jim Gonzalez he prefers to have his colonoscopy done by you. Was seen once or twice at [facility] for Jim Gonzalez.  Records are available via Epic for yo to review and advise on scheduling.   Thanks

## 2021-07-27 NOTE — Telephone Encounter (Signed)
If this is for screening colonoscopy, and that it can be directly booked with me in the [facility].  If he continues to have upper digestive symptoms, as he did when I previously saw him and subsequently was seen by Dr. Lynita Lombard, then he needs my next available new patient office visit.  -HD

## 2021-07-29 ENCOUNTER — Encounter: Payer: Self-pay | Admitting: Gastroenterology

## 2021-08-16 ENCOUNTER — Ambulatory Visit (AMBULATORY_SURGERY_CENTER): Payer: Self-pay

## 2021-08-16 VITALS — Ht 70.0 in | Wt 184.0 lb

## 2021-08-16 DIAGNOSIS — Z1211 Encounter for screening for malignant neoplasm of colon: Secondary | ICD-10-CM

## 2021-08-16 MED ORDER — NA SULFATE-K SULFATE-MG SULF 17.5-3.13-1.6 GM/177ML PO SOLN: 1.0000 | ORAL | 0 refills | Status: DC

## 2021-08-16 NOTE — Progress Notes (Signed)
No egg or soy allergy known to patient  No issues known to pt with past sedation with any surgeries or procedures Patient denies ever being told they had issues or difficulty with intubation  No FH of Malignant Hyperthermia Pt is not on diet pills Pt is not on  home 02  Pt is not on blood thinners  Pt denies issues with constipation  No A fib or A flutter Have any cardiac testing pending--denied Pt instructed to use [email or web address] or GoodRx for a price reduction on prep  Pt denies any GI issues over the past year.

## 2021-09-09 ENCOUNTER — Encounter: Payer: Self-pay | Admitting: Gastroenterology

## 2021-09-19 ENCOUNTER — Encounter: Payer: Self-pay | Admitting: Certified Registered Nurse Anesthetist

## 2021-09-20 ENCOUNTER — Telehealth: Payer: Self-pay | Admitting: Gastroenterology

## 2021-09-20 ENCOUNTER — Encounter: Payer: Self-pay | Admitting: Gastroenterology

## 2021-09-20 ENCOUNTER — Telehealth: Payer: Self-pay | Admitting: *Deleted

## 2021-09-20 NOTE — Telephone Encounter (Signed)
Instructions sent via secure e-mail and patient notified.

## 2021-09-20 NOTE — Telephone Encounter (Signed)
Inbound call from patient stating that he is scheduled to have procedure with Dr. Loletha Carrow tomorrow morning and was looking over his prep and seen where he was supposed to stop eating certain things 5 days prior. Patient stated that he has had corn and also last Wednesday  he had 2 advil as well as on saturday he had 2. Patient is requesting a call back to discuss if he can still have procedure. Please advise.

## 2021-09-20 NOTE — Telephone Encounter (Signed)
Spoke with pt and told to push fluids until procedure.  Also, told not to take any more Advil until after procedure.  Understanding voiced

## 2021-09-21 ENCOUNTER — Ambulatory Visit (AMBULATORY_SURGERY_CENTER): Payer: Self-pay | Admitting: Gastroenterology

## 2021-09-21 ENCOUNTER — Encounter: Payer: Self-pay | Admitting: Gastroenterology

## 2021-09-21 VITALS — BP 124/75 | HR 67 | Temp 97.7°F | Resp 14 | Ht 70.0 in | Wt 184.0 lb

## 2021-09-21 DIAGNOSIS — Z1211 Encounter for screening for malignant neoplasm of colon: Secondary | ICD-10-CM

## 2021-09-21 DIAGNOSIS — D1339 Benign neoplasm of other parts of small intestine: Secondary | ICD-10-CM

## 2021-09-21 DIAGNOSIS — K31A0 Gastric intestinal metaplasia, unspecified: Secondary | ICD-10-CM | POA: Diagnosis not present

## 2021-09-21 MED ORDER — SODIUM CHLORIDE 0.9 % IV SOLN: 500.0000 mL | Freq: Once | INTRAVENOUS | Status: DC

## 2021-09-21 NOTE — Progress Notes (Signed)
Report given to PACU, vss 

## 2021-09-21 NOTE — Op Note (Addendum)
[facility] Patient Name: Jim Gonzalez Procedure Date: 09/21/2021 7:27 AM MRN: [id number] Endoscopist: Mallie Mussel L. Loletha Carrow , MD Age: 51 Referring MD:  Date of Birth: 06-Dec-1970 Gender: Male Account #: [id number] Procedure:                Colonoscopy Indications:              Screening for colorectal malignant neoplasm, This                            is the patient's first colonoscopy Medicines:                Monitored Anesthesia Care Procedure:                Pre-Anesthesia Assessment:                           - Prior to the procedure, a History and Physical                            was performed, and patient medications and                            allergies were reviewed. The patient's tolerance of                            previous anesthesia was also reviewed. The risks                            and benefits of the procedure and the sedation                            options and risks were discussed with the patient.                            All questions were answered, and informed consent                            was obtained. Prior Anticoagulants: The patient has                            taken no previous anticoagulant or antiplatelet                            agents. ASA Grade Assessment: II - A patient with                            mild systemic disease. After reviewing the risks                            and benefits, the patient was deemed in                            satisfactory condition to undergo the procedure.  After obtaining informed consent, the colonoscope                            was passed under direct vision. Throughout the                            procedure, the patient's blood pressure, pulse, and                            oxygen saturations were monitored continuously. The                            Colonoscope was introduced through the anus and                            advanced to the the terminal  ileum, with                            identification of the appendiceal orifice and IC                            valve. The colonoscopy was performed without                            difficulty. The patient tolerated the procedure                            well. The quality of the bowel preparation was                            excellent. The terminal ileum, ileocecal valve,                            appendiceal orifice, and rectum were photographed. Scope In: 8:01:26 AM Scope Out: 8:16:13 AM Scope Withdrawal Time: 0 hours 11 minutes 49 seconds  Total Procedure Duration: 0 hours 14 minutes 47 seconds  Findings:                 The perianal and digital rectal examinations were                            normal.                           The terminal ileum contained one soft, submucosal                            nodule, approximately 10 mm in diameter. Normal                            overlying mucosa. "Tunneled" biopsies were taken                            with a cold forceps for histology. TI otherwise  normal.                           There is no endoscopic evidence of polyps in the                            entire colon.                           Repeat examination of right colon under NBI                            performed.                           A few small-mouthed diverticula were found in the                            left colon.                           The exam was otherwise without abnormality on                            direct and retroflexion views. Complications:            No immediate complications. Estimated Blood Loss:     Estimated blood loss was minimal. Impression:               - One nodule in the terminal ileum. Biopsied.                           - Diverticulosis in the left colon.                           - The examination was otherwise normal on direct                            and retroflexion  views. Recommendation:           - Patient has a contact number available for                            emergencies. The signs and symptoms of potential                            delayed complications were discussed with the                            patient. Return to normal activities tomorrow.                            Written discharge instructions were provided to the                            patient.                           -  Resume previous diet.                           - Continue present medications.                           - Await pathology results. Depending upon results,                            abdominal imaging may follow.                           - Repeat colonoscopy in 10 years for screening                            purposes. Kaikoa Magro L. Loletha Carrow, MD 09/21/2021 8:23:16 AM This report has been signed electronically.

## 2021-09-21 NOTE — Progress Notes (Signed)
Called to room to assist during endoscopic procedure.  Patient ID and intended procedure confirmed with present staff. Received instructions for my participation in the procedure from the performing physician.  

## 2021-09-21 NOTE — Progress Notes (Signed)
Pt's states no medical or surgical changes since previsit or office visit. 

## 2021-09-21 NOTE — Patient Instructions (Signed)
Thank you for coming in to see [place] today! Resume previous diet and medications/supplements today. Return to normal daily activities tomorrow.  Biopsy results will available 1-2 weeks.  Will make recommendations at that time for future colonoscopy.  YOU HAD AN ENDOSCOPIC PROCEDURE TODAY AT [facility]:   Refer to the procedure report that was given to you for any specific questions about what was found during the examination.  If the procedure report does not answer your questions, please call your gastroenterologist to clarify.  If you requested that your care partner not be given the details of your procedure findings, then the procedure report has been included in a sealed envelope for you to review at your convenience later.  YOU SHOULD EXPECT: Some feelings of bloating in the abdomen. Passage of more gas than usual.  Walking can help get rid of the air that was put into your GI tract during the procedure and reduce the bloating. If you had a lower endoscopy (such as a colonoscopy or flexible sigmoidoscopy) you may notice spotting of blood in your stool or on the toilet paper. If you underwent a bowel prep for your procedure, you may not have a normal bowel movement for a few days.  Please Note:  You might notice some irritation and congestion in your nose or some drainage.  This is from the oxygen used during your procedure.  There is no need for concern and it should clear up in a day or so.  SYMPTOMS TO REPORT IMMEDIATELY:  Following lower endoscopy (colonoscopy or flexible sigmoidoscopy):  Excessive amounts of blood in the stool  Significant tenderness or worsening of abdominal pains  Swelling of the abdomen that is new, acute  Fever of 100F or higher   For urgent or emergent issues, a gastroenterologist can be reached at any hour by calling [phone]. Do not use MyChart messaging for urgent concerns.    DIET:  We do recommend a small meal at first, but then you  may proceed to your regular diet.  Drink plenty of fluids but you should avoid alcoholic beverages for 24 hours.  ACTIVITY:  You should plan to take it easy for the rest of today and you should NOT DRIVE or use heavy machinery until tomorrow (because of the sedation medicines used during the test).    FOLLOW UP: Our staff will call the number listed on your records the next business day following your procedure.  We will call around 7:15- 8:00 am to check on you and address any questions or concerns that you may have regarding the information given to you following your procedure. If we do not reach you, we will leave a message.     If any biopsies were taken you will be contacted by phone or by letter within the next 1-3 weeks.  Please call us at [phone] if you have not heard about the biopsies in 3 weeks.    SIGNATURES/CONFIDENTIALITY: You and/or your care partner have signed paperwork which will be entered into your electronic medical record.  These signatures attest to the fact that that the information above on your After Visit Summary has been reviewed and is understood.  Full responsibility of the confidentiality of this discharge information lies with you and/or your care-partner.

## 2021-09-21 NOTE — Progress Notes (Signed)
History and Physical:  This patient presents for endoscopic testing for: Encounter Diagnosis  Name Primary?   Special screening for malignant neoplasms, colon Yes    Average risk for CRC - first screening colonoscopy. Patient denies chronic abdominal pain, rectal bleeding, constipation or diarrhea.   Patient is otherwise without complaints or active issues today.   Past Medical History: Past Medical History:  Diagnosis Date   Allergy    Anxiety    Atrial fibrillation    Eosinophilic esophagitis    GERD (gastroesophageal reflux disease)    Hypertension    IBS (irritable bowel syndrome)    Migraines    Schatzki's ring      Past Surgical History: Past Surgical History:  Procedure Laterality Date   ADENOIDECTOMY     ARTHROSCOPIC REPAIR ACL     Left knee surgery     ACL   MYRINGOTOMY     SHOULDER ARTHROSCOPY Right 2021    Allergies: Allergies  Allergen Reactions   Penicillins Hives    Has patient had a PCN reaction causing immediate rash, facial/tongue/throat swelling, SOB or lightheadedness with hypotension: Yes Has patient had a PCN reaction causing severe rash involving mucus membranes or skin necrosis: No Has patient had a PCN reaction that required hospitalization: No Has patient had a PCN reaction occurring within the last 10 years: No If all of the above answers are "NO", then may proceed with Cephalosporin use.     Outpatient Meds: Current Outpatient Medications  Medication Sig Dispense Refill   atenolol (TENORMIN) 25 MG tablet 1 tablet daily and may take an extra tablet as needed for palpitations. 180 tablet 3   omeprazole (PRILOSEC) 20 MG capsule Take 20 mg by mouth daily.     aspirin EC 81 MG tablet Take 1 tablet (81 mg total) by mouth daily. Swallow whole. 90 tablet 3   Current Facility-Administered Medications  Medication Dose Route Frequency Provider Last Rate Last Admin   0.9 %  sodium chloride infusion  500 mL Intravenous Once Nelida Meuse  III, MD          ___________________________________________________________________ Objective   Exam:  BP 120/74   Pulse 73   Temp 97.7 F (36.5 C) (Temporal)   Ht '5\' 10"'$  (1.778 m)   Wt 184 lb (83.5 kg)   SpO2 98%   BMI 26.40 kg/m   CV: RRR without murmur, S1/S2 Resp: clear to auscultation bilaterally, normal RR and effort noted GI: soft, no tenderness, with active bowel sounds.   Assessment: Encounter Diagnosis  Name Primary?   Special screening for malignant neoplasms, colon Yes     Plan: Colonoscopy  The benefits and risks of the planned procedure were described in detail with the patient or (when appropriate) their health care proxy.  Risks were outlined as including, but not limited to, bleeding, infection, perforation, adverse medication reaction leading to cardiac or pulmonary decompensation, pancreatitis (if ERCP).  The limitation of incomplete mucosal visualization was also discussed.  No guarantees or warranties were given.    The patient is appropriate for an endoscopic procedure in the ambulatory setting.   - Wilfrid Lund, MD

## 2021-09-22 ENCOUNTER — Telehealth: Payer: Self-pay | Admitting: *Deleted

## 2021-09-22 NOTE — Telephone Encounter (Signed)
Attempted f/u phone call. No answer. Left message. °

## 2021-09-29 ENCOUNTER — Other Ambulatory Visit: Payer: Self-pay

## 2021-09-29 DIAGNOSIS — D1339 Benign neoplasm of other parts of small intestine: Secondary | ICD-10-CM

## 2021-10-12 ENCOUNTER — Telehealth: Payer: Self-pay | Admitting: Gastroenterology

## 2021-10-12 NOTE — Telephone Encounter (Signed)
Secure staff message was sent to radiology scheduling on 9/27 to contact patient to schedule. I sent another message to radiology scheduling to contact patient today. Called and spoke with patient. I apologized about the delay, I provided patient with the phone # to radiology scheduling so that he can get his CT rescheduled. Pt verbalized understanding.

## 2021-10-12 NOTE — Telephone Encounter (Signed)
Inbound call from patient stating that he was needing to be scheduled for a CT scan and has not heard anything more about scheduling. Patient is requesting a call back to discuss. Please advise.

## 2021-10-15 ENCOUNTER — Ambulatory Visit (HOSPITAL_COMMUNITY)
Admission: RE | Admit: 2021-10-15 | Discharge: 2021-10-15 | Disposition: A | Discharge status: Home / Self Care | Payer: BC Managed Care – PPO | Source: Ambulatory Visit | Attending: Gastroenterology | Admitting: Gastroenterology

## 2021-10-15 ENCOUNTER — Encounter (HOSPITAL_COMMUNITY): Payer: Self-pay

## 2021-10-15 DIAGNOSIS — N200 Calculus of kidney: Secondary | ICD-10-CM | POA: Diagnosis not present

## 2021-10-15 DIAGNOSIS — D1339 Benign neoplasm of other parts of small intestine: Secondary | ICD-10-CM | POA: Insufficient documentation

## 2021-10-15 DIAGNOSIS — E079 Disorder of thyroid, unspecified: Secondary | ICD-10-CM | POA: Diagnosis not present

## 2021-10-15 MED ORDER — IOHEXOL 350 MG/ML SOLN
100.0000 mL | Freq: Once | INTRAVENOUS | Status: AC | PRN
Start: 2021-10-15 — End: 2021-10-15
  Administered 2021-10-15: 100 mL via INTRAVENOUS

## 2021-11-30 NOTE — Progress Notes (Signed)
HPI: FU atrial fibrillation. Patient had probable atrial fibrillation in January of 2013 for approximately 5 minutes. Recurrent episode in April 2013 requiring visit to ER. Patient was seen at [facility] for his atrial fibrillation. The patient was given a prescription for flecainide to be used as needed. Did not tolerate toprol previously; cardizem changed to atenolol at previous ov due to worse GERD; preferred ASA to DOAC. Echocardiogram March 2022 showed normal LV function.  CTA October 2023 showed aortic atherosclerosis.  Since I last saw him, the patient denies any dyspnea on exertion, orthopnea, PND, pedal edema, palpitations, syncope or chest pain.   Current Outpatient Medications  Medication Sig Dispense Refill   aspirin EC 81 MG tablet Take 1 tablet (81 mg total) by mouth daily. Swallow whole. 90 tablet 3   atenolol (TENORMIN) 25 MG tablet 1 tablet daily and may take an extra tablet as needed for palpitations. 180 tablet 3   omeprazole (PRILOSEC) 20 MG capsule Take 20 mg by mouth daily.     No current facility-administered medications for this visit.     Past Medical History:  Diagnosis Date   Allergy    Anxiety    Atrial fibrillation    Eosinophilic esophagitis    GERD (gastroesophageal reflux disease)    Hypertension    IBS (irritable bowel syndrome)    Migraines    Schatzki's ring     Past Surgical History:  Procedure Laterality Date   ADENOIDECTOMY     ARTHROSCOPIC REPAIR ACL     Left knee surgery     ACL   MYRINGOTOMY     SHOULDER ARTHROSCOPY Right 2021    Social History   Socioeconomic History   Marital status: Married    Spouse name: Not on file   Number of children: 2   Years of education: Not on file   Highest education level: Not on file  Occupational History    Employer: [employer]    Comment: [employer]  Tobacco Use   Smoking status: Never   Smokeless tobacco: Never  Vaping Use   Vaping Use: Never used  Substance and Sexual Activity    Alcohol use: Yes    Alcohol/week: 2.0 standard drinks of alcohol    Types: 2 Glasses of wine per week    Comment: Glass of wine every other night   Drug use: No   Sexual activity: Yes    Partners: Female  Other Topics Concern   Not on file  Social History Narrative   Not on file   Social Determinants of Health   Financial Resource Strain: Not on file  Food Insecurity: Not on file  Transportation Needs: Not on file  Physical Activity: Not on file  Stress: Not on file  Social Connections: Not on file  Intimate Partner Violence: Not on file    Family History  Problem Relation Age of Onset   Anxiety disorder Mother    GER disease Mother    Hypertension Mother    Heart disease Father        Cardiomyopathy   GER disease Father    Colon cancer Neg Hx    Stomach cancer Neg Hx    Esophageal cancer Neg Hx    Rectal cancer Neg Hx    Colon polyps Neg Hx     ROS: no fevers or chills, productive cough, hemoptysis, dysphasia, odynophagia, melena, hematochezia, dysuria, hematuria, rash, seizure activity, orthopnea, PND, pedal edema, claudication. Remaining systems are negative.  Physical Exam:  Well-developed well-nourished in no acute distress.  Skin is warm and dry.  HEENT is normal.  Neck is supple.  Chest is clear to auscultation with normal expansion.  Cardiovascular exam is regular rate and rhythm.  Abdominal exam nontender or distended. No masses palpated. Extremities show no edema. neuro grossly intact  ECG-normal sinus rhythm at a rate of 61, no ST changes.  Personally reviewed  A/P  1 paroxysmal atrial fibrillation-patient is in sinus rhythm today.  We will continue atenolol for rate control if atrial fibrillation recurs.  Continue flecainide as needed.  CHA2DS2-VASc is 1 for hypertension.  He has previously elected to continue aspirin instead of DOAC.  2 hypertension-blood pressure controlled.  Continue present medications.  3 aortic atherosclerosis-noted on  recent CT scan.  Recent LDL not at goal.  We will add Crestor 20 mg daily.  Check lipids, CK and liver in 8 weeks.  Kirk Ruths, MD

## 2021-12-10 ENCOUNTER — Encounter: Payer: Self-pay | Admitting: Cardiology

## 2021-12-10 ENCOUNTER — Ambulatory Visit: Payer: BC Managed Care – PPO | Attending: Cardiology | Admitting: Cardiology

## 2021-12-10 VITALS — BP 142/90 | HR 61 | Ht 69.5 in | Wt 187.2 lb

## 2021-12-10 DIAGNOSIS — I700 Atherosclerosis of aorta: Secondary | ICD-10-CM | POA: Diagnosis not present

## 2021-12-10 DIAGNOSIS — I10 Essential (primary) hypertension: Secondary | ICD-10-CM

## 2021-12-10 DIAGNOSIS — I480 Paroxysmal atrial fibrillation: Secondary | ICD-10-CM

## 2021-12-10 MED ORDER — ROSUVASTATIN CALCIUM 20 MG PO TABS: 20.0000 mg | ORAL_TABLET | Freq: Every day | ORAL | 3 refills | Status: DC

## 2021-12-10 NOTE — Patient Instructions (Addendum)
Medication Instructions:  START: ROSUVASTATIN '20mg'$  ONCE DAILY  *If you need a refill on your cardiac medications before your next appointment, please call your pharmacy*  Lab Work: Please return for FASTING Blood Work in [place]. No appointment needed, lab here at the office is open Monday-Friday from 8AM to 4PM and closed daily for lunch from 12:45-1:45.   If you have labs (blood work) drawn today and your tests are completely normal, you will receive your results only by: [place] (if you have MyChart) OR A paper copy in the mail If you have any lab test that is abnormal or we need to change your treatment, we will call you to review the results.  Testing/Procedures: None Ordered At This Time.   Follow-Up: At [facility], you and your health needs are our priority.  As part of our continuing mission to provide you with exceptional heart care, we have created designated Provider Care Teams.  These Care Teams include your primary Cardiologist (physician) and Advanced Practice Providers (APPs -  Physician Assistants and Nurse Practitioners) who all work together to provide you with the care you need, when you need it.  Your next appointment:   1 year(s)  The format for your next appointment:   In Person  Provider:   Kirk Ruths, MD     Other Instructions  You can buy these on [place], [place], [place], [employer]        Why follow it? Research shows. Those who follow the Mediterranean diet have a reduced risk of heart disease  The diet is associated with a reduced incidence of Parkinson's and Alzheimer's diseases People following the diet may have longer life expectancies and lower rates of chronic diseases  The Dietary Guidelines for Americans recommends the Mediterranean diet as an eating plan to promote health and prevent disease  What Is the Mediterranean Diet?  Healthy eating plan based on typical foods and recipes of Mediterranean-style cooking The diet is  primarily a plant based diet; these foods should make up a majority of meals   Starches - Plant based foods should make up a majority of meals - They are an important sources of vitamins, minerals, energy, antioxidants, and fiber - Choose whole grains, foods high in fiber and minimally processed items  - Typical grain sources include wheat, oats, barley, corn, brown rice, bulgar, farro, millet, polenta, couscous  - Various types of beans include chickpeas, lentils, fava beans, black beans, white beans   Fruits  Veggies - Large quantities of antioxidant rich fruits & veggies; 6 or more servings  - Vegetables can be eaten raw or lightly drizzled with oil and cooked  - Vegetables common to the traditional Mediterranean Diet include: artichokes, arugula, beets, broccoli, brussel sprouts, cabbage, carrots, celery, collard greens, cucumbers, eggplant, kale, leeks, lemons, lettuce, mushrooms, okra, onions, peas, peppers, potatoes, pumpkin, radishes, rutabaga, shallots, spinach, sweet potatoes, turnips, zucchini - Fruits common to the Mediterranean Diet include: apples, apricots, avocados, cherries, clementines, dates, figs, grapefruits, grapes, melons, nectarines, oranges, peaches, pears, pomegranates, strawberries, tangerines  Fats - Replace butter and margarine with healthy oils, such as olive oil, canola oil, and tahini  - Limit nuts to no more than a handful a day  - Nuts include walnuts, almonds, pecans, pistachios, pine nuts  - Limit or avoid candied, honey roasted or heavily salted nuts - Olives are central to the Mediterranean diet - can be eaten whole or used in a variety of dishes   Meats Protein -  Limiting red meat: no more than a few times a month - When eating red meat: choose lean cuts and keep the portion to the size of deck of cards - Eggs: approx. 0 to 4 times a week  - Fish and lean poultry: at least 2 a week  - Healthy protein sources include, chicken, [place], lean beef, lamb -  Increase intake of seafood such as tuna, salmon, trout, mackerel, shrimp, scallops - Avoid or limit high fat processed meats such as sausage and bacon  Dairy - Include moderate amounts of low fat dairy products  - Focus on healthy dairy such as fat free yogurt, skim milk, low or reduced fat cheese - Limit dairy products higher in fat such as whole or 2% milk, cheese, ice cream  Alcohol - Moderate amounts of red wine is ok  - No more than 5 oz daily for women (all ages) and men older than age 40  - No more than 10 oz of wine daily for men younger than 24  Other - Limit sweets and other desserts  - Use herbs and spices instead of salt to flavor foods  - Herbs and spices common to the traditional Mediterranean Diet include: basil, bay leaves, chives, cloves, cumin, fennel, garlic, lavender, marjoram, mint, oregano, parsley, pepper, rosemary, sage, savory, sumac, tarragon, thyme   It's not just a diet, it's a lifestyle:  The Mediterranean diet includes lifestyle factors typical of those in the region  Foods, drinks and meals are best eaten with others and savored Daily physical activity is important for overall good health This could be strenuous exercise like running and aerobics This could also be more leisurely activities such as walking, housework, yard-work, or taking the stairs Moderation is the key; a balanced and healthy diet accommodates most foods and drinks Consider portion sizes and frequency of consumption of certain foods   Meal Ideas & Options:  Breakfast:  Whole wheat toast or whole wheat English muffins with peanut butter & hard boiled egg Steel cut oats topped with apples & cinnamon and skim milk  Fresh fruit: banana, strawberries, melon, berries, peaches  Smoothies: strawberries, bananas, greek yogurt, peanut butter Low fat greek yogurt with blueberries and granola  Egg white omelet with spinach and mushrooms Breakfast couscous: whole wheat couscous, apricots, skim milk,  cranberries  Sandwiches:  Hummus and grilled vegetables (peppers, zucchini, squash) on whole wheat bread   Grilled chicken on whole wheat pita with lettuce, tomatoes, cucumbers or tzatziki  [place] salad on whole wheat bread: tuna salad made with greek yogurt, olives, red peppers, capers, green onions Garlic rosemary lamb pita: lamb sauted with garlic, rosemary, salt & pepper; add lettuce, cucumber, greek yogurt to pita - flavor with lemon juice and black pepper  Seafood:  Mediterranean grilled salmon, seasoned with garlic, basil, parsley, lemon juice and black pepper Shrimp, lemon, and spinach whole-grain pasta salad made with low fat greek yogurt  Seared scallops with lemon orzo  Seared tuna steaks seasoned salt, pepper, coriander topped with tomato mixture of olives, tomatoes, olive oil, minced garlic, parsley, green onions and cappers  Meats:  Herbed greek chicken salad with kalamata olives, cucumber, feta  Red bell peppers stuffed with spinach, bulgur, lean ground beef (or lentils) & topped with feta   Kebabs: skewers of chicken, tomatoes, onions, zucchini, squash  [place] burgers: made with red onions, mint, dill, lemon juice, feta cheese topped with roasted red peppers Vegetarian Cucumber salad: cucumbers, artichoke hearts, celery, red onion, feta cheese, tossed  in olive oil & lemon juice  Hummus and whole grain pita points with a greek salad (lettuce, tomato, feta, olives, cucumbers, red onion) Lentil soup with celery, carrots made with vegetable broth, garlic, salt and pepper  Tabouli salad: parsley, bulgur, mint, scallions, cucumbers, tomato, radishes, lemon juice, olive oil, salt and pepper.     Mediterranean Diet A Mediterranean diet refers to food and lifestyle choices that are based on the traditions of countries located on the [employer]. It focuses on eating more fruits, vegetables, whole grains, beans, nuts, seeds, and heart-healthy fats, and eating less dairy, meat, eggs,  and processed foods with added sugar, salt, and fat. This way of eating has been shown to help prevent certain conditions and improve outcomes for people who have chronic diseases, like kidney disease and heart disease. What are tips for following this plan? Reading food labels Check the serving size of packaged foods. For foods such as rice and pasta, the serving size refers to the amount of cooked product, not dry. Check the total fat in packaged foods. Avoid foods that have saturated fat or trans fats. Check the ingredient list for added sugars, such as corn syrup. Shopping  Buy a variety of foods that offer a balanced diet, including: Fresh fruits and vegetables (produce). Grains, beans, nuts, and seeds. Some of these may be available in unpackaged forms or large amounts (in bulk). Fresh seafood. Poultry and eggs. Low-fat dairy products. Buy whole ingredients instead of prepackaged foods. Buy fresh fruits and vegetables in-season from local farmers markets. Buy plain frozen fruits and vegetables. If you do not have access to quality fresh seafood, buy precooked frozen shrimp or canned fish, such as tuna, salmon, or sardines. Stock your pantry so you always have certain foods on hand, such as olive oil, canned tuna, canned tomatoes, rice, pasta, and beans. Cooking Cook foods with extra-virgin olive oil instead of using butter or other vegetable oils. Have meat as a side dish, and have vegetables or grains as your main dish. This means having meat in small portions or adding small amounts of meat to foods like pasta or stew. Use beans or vegetables instead of meat in common dishes like chili or lasagna. Experiment with different cooking methods. Try roasting, broiling, steaming, and sauting vegetables. Add frozen vegetables to soups, stews, pasta, or rice. Add nuts or seeds for added healthy fats and plant protein at each meal. You can add these to yogurt, salads, or vegetable  dishes. Marinate fish or vegetables using olive oil, lemon juice, garlic, and fresh herbs. Meal planning Plan to eat one vegetarian meal one day each week. Try to work up to two vegetarian meals, if possible. Eat seafood two or more times a week. Have healthy snacks readily available, such as: Vegetable sticks with hummus. Greek yogurt. Fruit and nut trail mix. Eat balanced meals throughout the week. This includes: Fruit: 2-3 servings a day. Vegetables: 4-5 servings a day. Low-fat dairy: 2 servings a day. Fish, poultry, or lean meat: 1 serving a day. Beans and legumes: 2 or more servings a week. Nuts and seeds: 1-2 servings a day. Whole grains: 6-8 servings a day. Extra-virgin olive oil: 3-4 servings a day. Limit red meat and sweets to only a few servings a month. Lifestyle  Cook and eat meals together with your family, when possible. Drink enough fluid to keep your urine pale yellow. Be physically active every day. This includes: Aerobic exercise like running or swimming. Leisure activities like gardening, walking,  or housework. Get 7-8 hours of sleep each night. If recommended by your health care provider, drink red wine in moderation. This means 1 glass a day for nonpregnant women and 2 glasses a day for men. A glass of wine equals 5 oz (150 mL). What foods should I eat? Fruits Apples. Apricots. Avocado. Berries. Bananas. Cherries. Dates. Figs. Grapes. Lemons. Melon. Oranges. Peaches. Plums. Pomegranate. Vegetables Artichokes. Beets. Broccoli. Cabbage. Carrots. Eggplant. Green beans. Chard. Kale. Spinach. Onions. Leeks. Peas. Squash. Tomatoes. Peppers. Radishes. Grains Whole-grain pasta. Brown rice. Bulgur wheat. Polenta. Couscous. Whole-wheat bread. [place]. Meats and other proteins Beans. Almonds. Sunflower seeds. Pine nuts. Peanuts. [place]. Salmon. Scallops. Shrimp. [place]. Tilapia. Clams. Oysters. Eggs. Poultry without skin. Dairy Low-fat milk. Cheese. Greek  yogurt. Fats and oils Extra-virgin olive oil. Avocado oil. Grapeseed oil. Beverages Water. Red wine. Herbal tea. Sweets and desserts Greek yogurt with honey. Baked apples. Poached pears. Trail mix. Seasonings and condiments Basil. Cilantro. Coriander. Cumin. Mint. Parsley. Sage. Rosemary. Tarragon. Garlic. Oregano. Thyme. Pepper. Balsamic vinegar. Tahini. Hummus. Tomato sauce. Olives. Mushrooms. The items listed above may not be a complete list of foods and beverages you can eat. Contact a dietitian for more information. What foods should I limit? This is a list of foods that should be eaten rarely or only on special occasions. Fruits Fruit canned in syrup. Vegetables Deep-fried potatoes (french fries). Grains Prepackaged pasta or rice dishes. Prepackaged cereal with added sugar. Prepackaged snacks with added sugar. Meats and other proteins Beef. Pork. Lamb. Poultry with skin. Hot dogs. Berniece Salines. Dairy Ice cream. Sour cream. Whole milk. Fats and oils Butter. Canola oil. Vegetable oil. Beef fat (tallow). Lard. Beverages Juice. Sugar-sweetened soft drinks. Beer. Liquor and spirits. Sweets and desserts Cookies. Cakes. Pies. Candy. Seasonings and condiments Mayonnaise. Pre-made sauces and marinades. The items listed above may not be a complete list of foods and beverages you should limit. Contact a dietitian for more information. Summary The Mediterranean diet includes both food and lifestyle choices. Eat a variety of fresh fruits and vegetables, beans, nuts, seeds, and whole grains. Limit the amount of red meat and sweets that you eat. If recommended by your health care provider, drink red wine in moderation. This means 1 glass a day for nonpregnant women and 2 glasses a day for men. A glass of wine equals 5 oz (150 mL). This information is not intended to replace advice given to you by your health care provider. Make sure you discuss any questions you have with your health care  provider. Document Revised: 01/25/2019 Document Reviewed: 11/22/2018 Elsevier Patient Education  [place].

## 2022-02-19 LAB — LIPID PANEL
Chol/HDL Ratio: 3 ratio (ref 0.0–5.0)
Cholesterol, Total: 127 mg/dL (ref 100–199)
HDL: 42 mg/dL (ref >39)
LDL Chol Calc (NIH): 57 mg/dL (ref 0–99)
Triglycerides: 164 mg/dL — ABNORMAL HIGH (ref 0–149)
VLDL Cholesterol Cal: 28 mg/dL (ref 5–40)

## 2022-02-19 LAB — HEPATIC FUNCTION PANEL
ALT: 37 IU/L (ref 0–44)
AST: 23 IU/L (ref 0–40)
Albumin: 4.7 g/dL (ref 4.1–5.1)
Alkaline Phosphatase: 68 IU/L (ref 44–121)
Bilirubin Total: 0.7 mg/dL (ref 0.0–1.2)
Bilirubin, Direct: 0.2 mg/dL (ref 0.00–0.40)
Total Protein: 6.9 g/dL (ref 6.0–8.5)

## 2022-02-19 LAB — CK: Total CK: 107 U/L (ref 49–439)

## 2022-03-01 IMAGING — US US ABDOMEN LIMITED
1 series · 14 of 17 positions shown · non-contrast
Comparison: None.

CLINICAL DATA: Palpable lump right of umbilicus and superficial
soft tissues.

EXAM:
ULTRASOUND ABDOMEN LIMITED

[Series 1: us abdomen limited · 0.05mm/px · 14 of 17 slices shown]
[im 1/17]
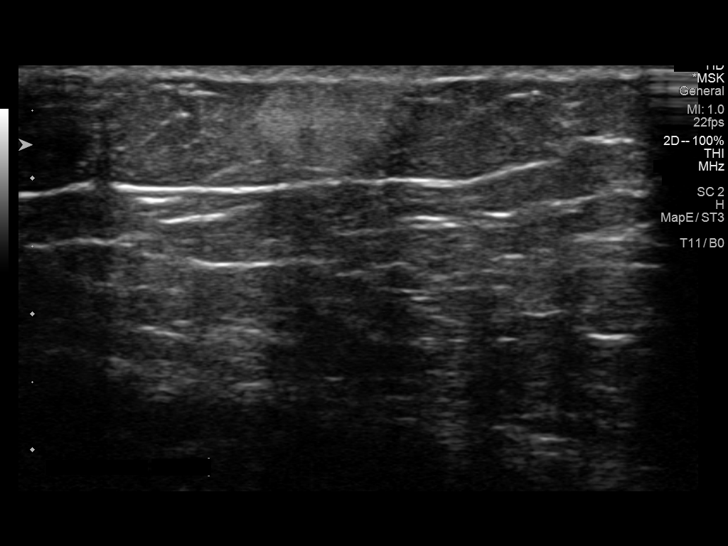
[im 2/17]
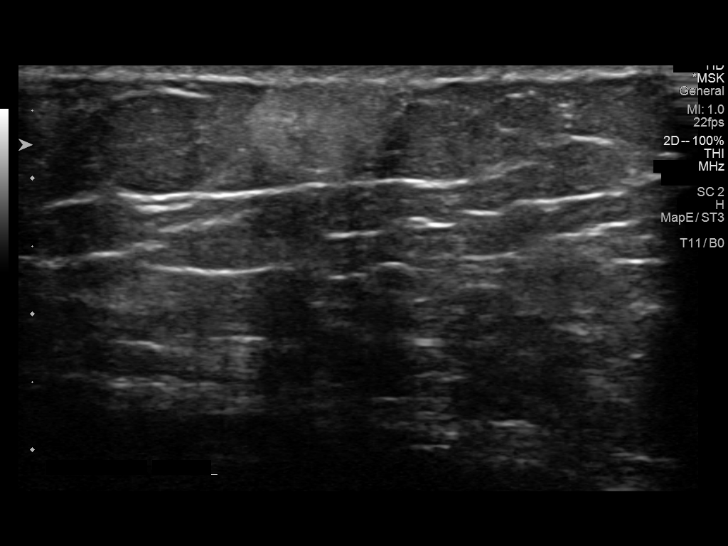
[im 4/17]
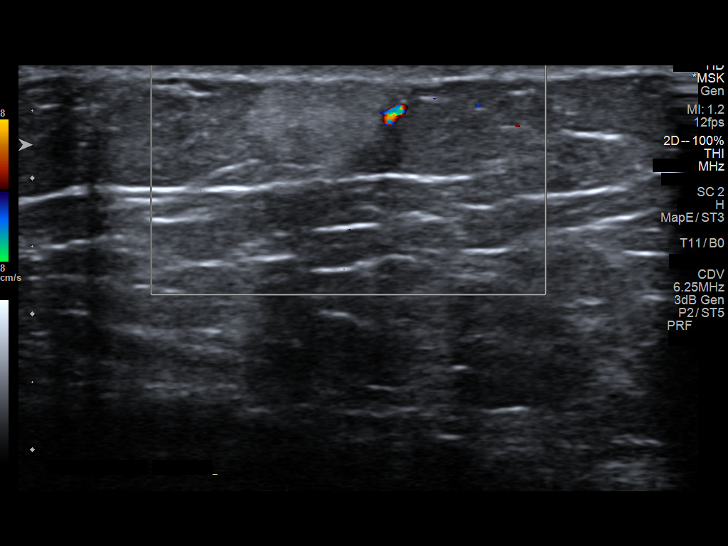
[im 5/17]
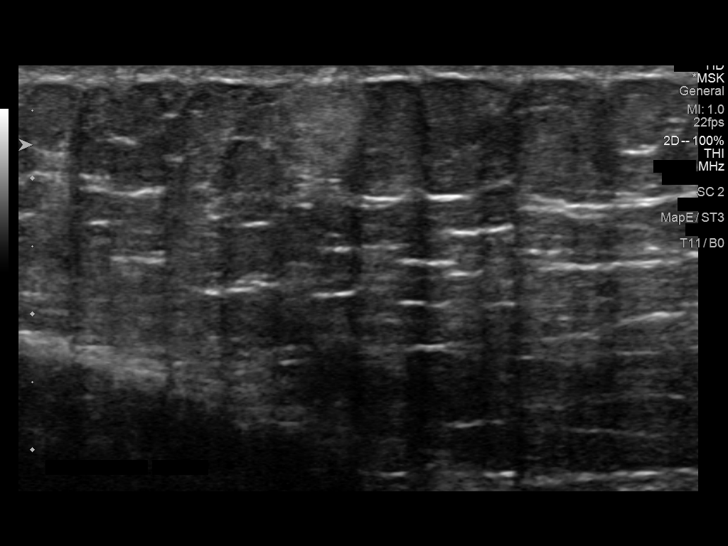
[im 6/17]
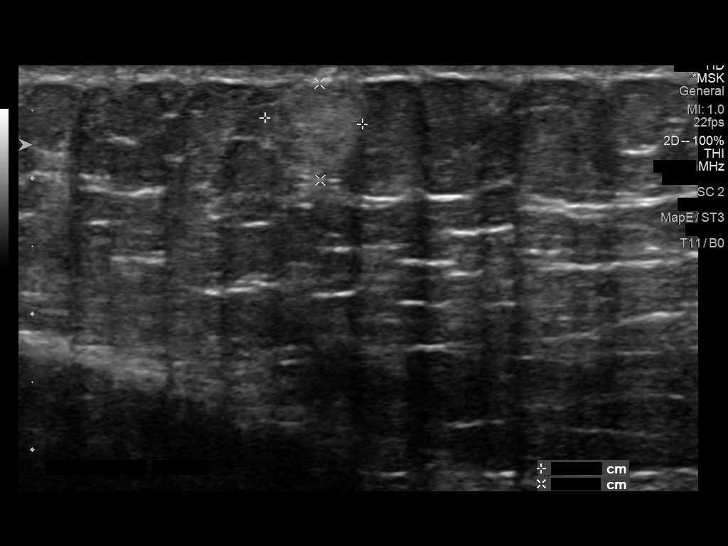
[im 7/17]
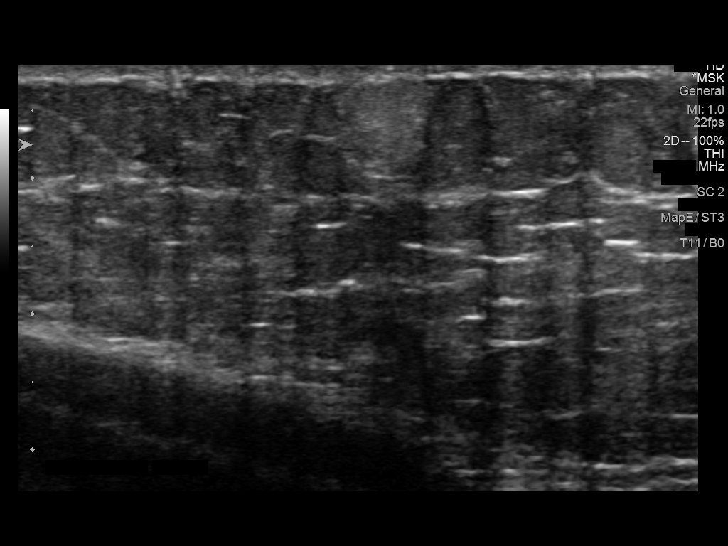
[im 8/17]
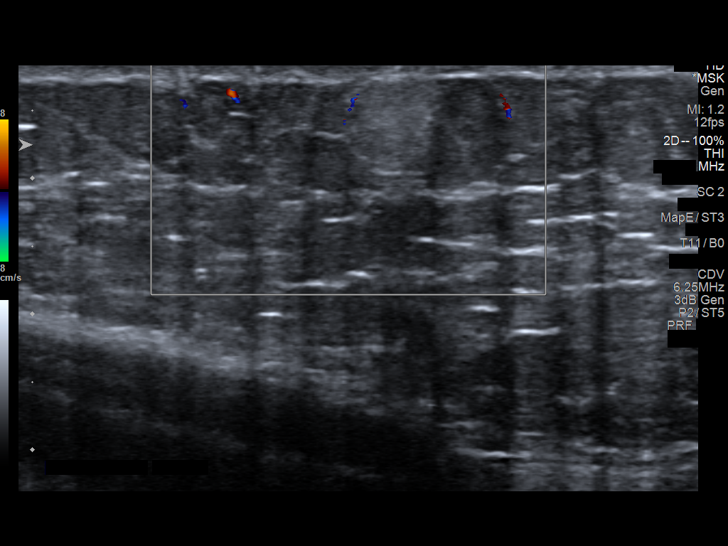
[im 10/17]
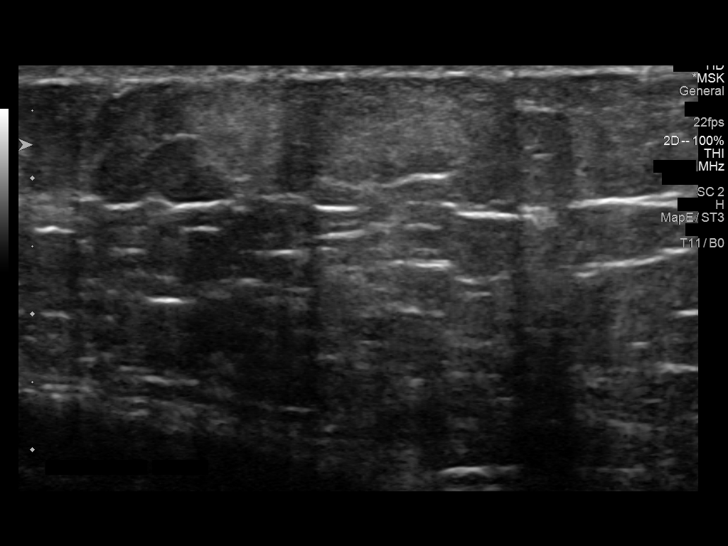
[im 11/17]
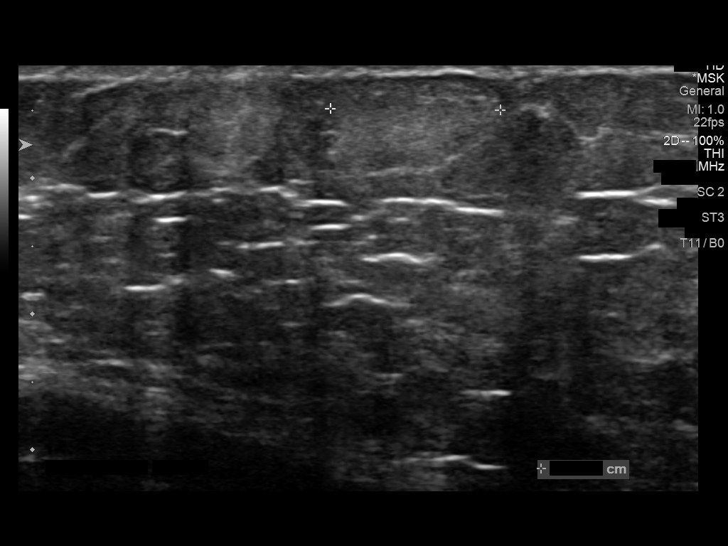
[im 12/17]
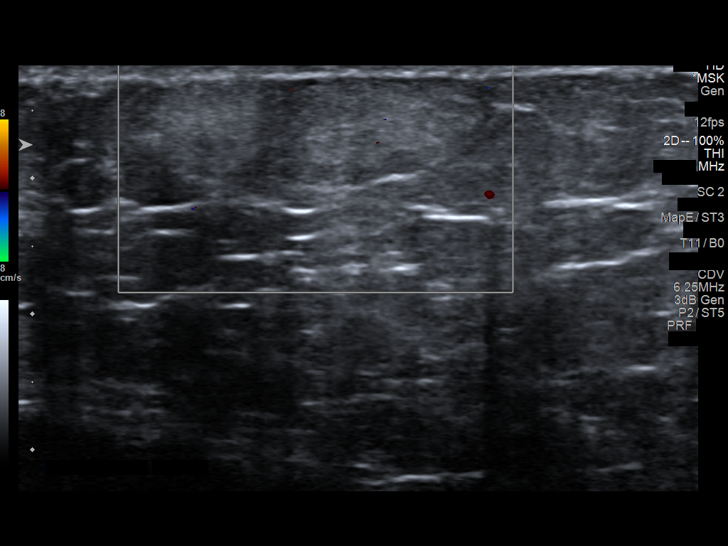
[im 13/17]
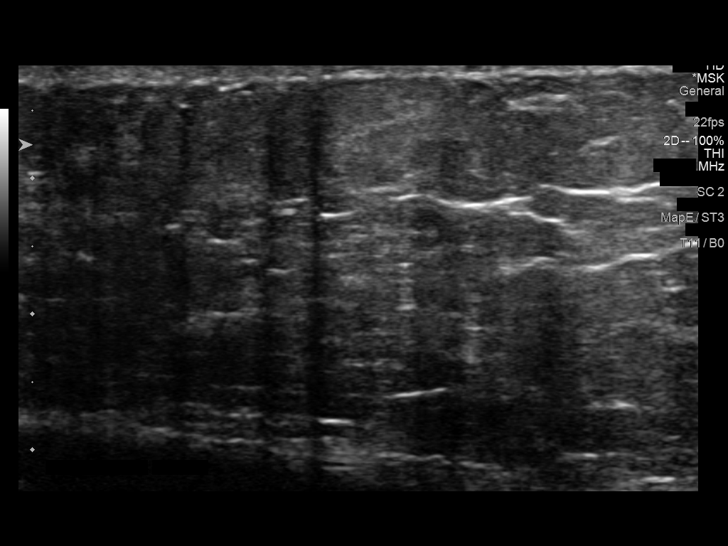
[im 14/17]
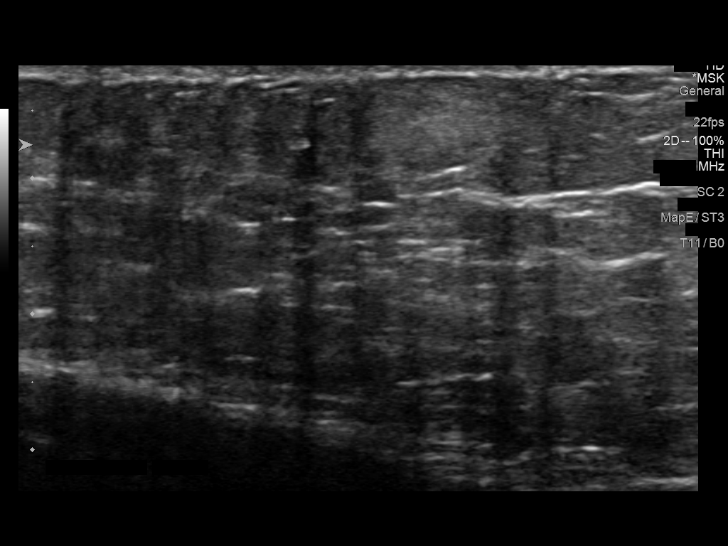
[im 16/17]
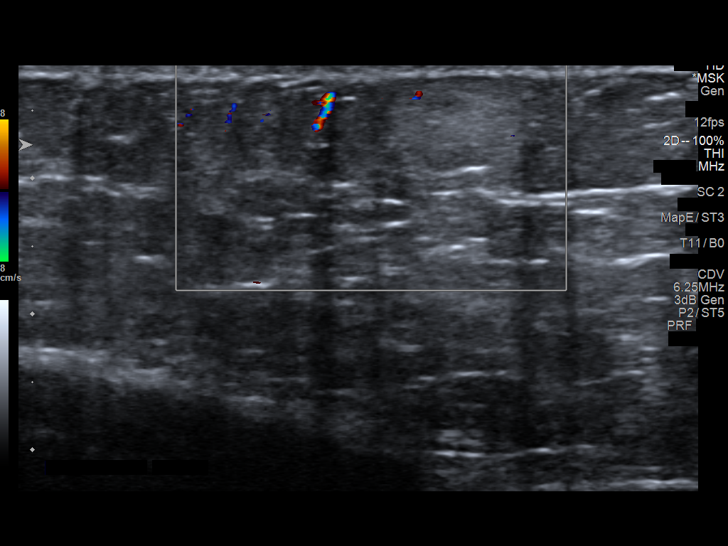
[im 17/17]
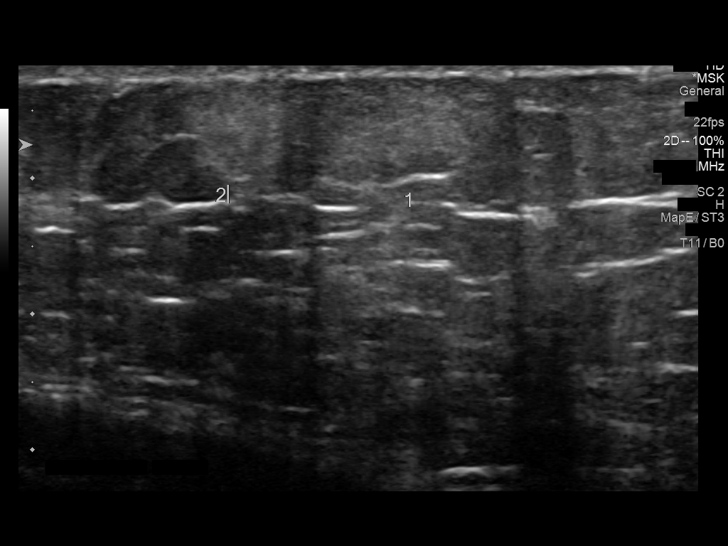

[14 of 17 positions shown; findings below may reference images not displayed]

FINDINGS: There is a 1.0 x 0.7 x 1.3 cm hyperechoic focus in area of palpable
abnormality. Additionally there is a 7 x 7 x 10 mm hyperechoic focus
just adjacent to first area. These are located within the
superficial subcutaneous tissues just beneath the skin surface. No
significant vascular flow identified.
IMPRESSION: Two small hypoechoic areas in the area of palpable abnormality in
the superficial subcutaneous tissues. Findings may represent
lipomas, but other etiologies are not excluded.

## 2022-03-15 ENCOUNTER — Telehealth: Payer: Self-pay | Admitting: Cardiology

## 2022-03-15 MED ORDER — ROSUVASTATIN CALCIUM 20 MG PO TABS: 20.0000 mg | ORAL_TABLET | Freq: Every day | ORAL | 3 refills | Status: DC

## 2022-03-15 NOTE — Telephone Encounter (Signed)
*  STAT* If patient is at the pharmacy, call can be transferred to refill team.   1. Which medications need to be refilled? (please list name of each medication and dose if known) rosuvastatin (CRESTOR) 20 MG tablet   2. Which pharmacy/location (including street and city if local pharmacy) is medication to be sent to? [id number] - [place], [place]   3. Do they need a 30 day or 90 day supply? [place]

## 2022-03-16 ENCOUNTER — Encounter: Payer: Self-pay | Admitting: Cardiology

## 2022-08-09 ENCOUNTER — Other Ambulatory Visit: Payer: Self-pay | Admitting: Cardiology

## 2022-08-09 DIAGNOSIS — I480 Paroxysmal atrial fibrillation: Secondary | ICD-10-CM

## 2023-02-05 ENCOUNTER — Other Ambulatory Visit: Payer: Self-pay | Admitting: Cardiology

## 2023-02-05 DIAGNOSIS — I480 Paroxysmal atrial fibrillation: Secondary | ICD-10-CM

## 2023-02-14 NOTE — Progress Notes (Deleted)
 HPI: FU atrial fibrillation. Patient had probable atrial fibrillation in January of 2013 for approximately 5 minutes. Recurrent episode in April 2013 requiring visit to ER. Patient was seen at [facility] for his atrial fibrillation. The patient was given a prescription for flecainide to be used as needed. Did not tolerate toprol previously; cardizem changed to atenolol at previous ov due to worse GERD; preferred ASA to DOAC. Echocardiogram March 2022 showed normal LV function.  CTA October 2023 showed aortic atherosclerosis.  Since I last saw him,   Current Outpatient Medications  Medication Sig Dispense Refill   aspirin EC 81 MG tablet Take 1 tablet (81 mg total) by mouth daily. Swallow whole. 90 tablet 3   atenolol (TENORMIN) 25 MG tablet TAKE 1 TABLET BY MOUTH DAILY MAY TAKE EXTRA TABLET AS NEEDED FOR PALPITATIONS 180 tablet 0   omeprazole (PRILOSEC) 20 MG capsule Take 20 mg by mouth daily.     rosuvastatin (CRESTOR) 20 MG tablet Take 1 tablet (20 mg total) by mouth daily. 90 tablet 3   No current facility-administered medications for this visit.     Past Medical History:  Diagnosis Date   Allergy    Anxiety    Atrial fibrillation    Eosinophilic esophagitis    GERD (gastroesophageal reflux disease)    Hypertension    IBS (irritable bowel syndrome)    Migraines    Schatzki's ring     Past Surgical History:  Procedure Laterality Date   ADENOIDECTOMY     ARTHROSCOPIC REPAIR ACL     Left knee surgery     ACL   MYRINGOTOMY     SHOULDER ARTHROSCOPY Right 2021    Social History   Socioeconomic History   Marital status: Married    Spouse name: Not on file   Number of children: 2   Years of education: Not on file   Highest education level: Not on file  Occupational History    Employer: [employer]    Comment: [employer]  Tobacco Use   Smoking status: Never   Smokeless tobacco: Never  Vaping Use   Vaping status: Never Used  Substance and Sexual Activity   Alcohol use:  Yes    Alcohol/week: 2.0 standard drinks of alcohol    Types: 2 Glasses of wine per week    Comment: Glass of wine every other night   Drug use: No   Sexual activity: Yes    Partners: Female  Other Topics Concern   Not on file  Social History Narrative   Not on file   Social Drivers of Health   Financial Resource Strain: Not on file  Food Insecurity: Not on file  Transportation Needs: Not on file  Physical Activity: Not on file  Stress: Not on file (11/09/2022)  Social Connections: Not on file  Intimate Partner Violence: Low Risk  (04/19/2019)   Received from [facility]   Intimate Partner Violence    Insults You: Not on file    Threatens You: Not on file    Screams at [place]: Not on file    Physically Hurt: Not on file    Intimate Partner Violence Score: Not on file    Family History  Problem Relation Age of Onset   Anxiety disorder Mother    GER disease Mother    Hypertension Mother    Heart disease Father        Cardiomyopathy   GER disease Father    Colon cancer Neg  Hx    Stomach cancer Neg Hx    Esophageal cancer Neg Hx    Rectal cancer Neg Hx    Colon polyps Neg Hx     ROS: no fevers or chills, productive cough, hemoptysis, dysphasia, odynophagia, melena, hematochezia, dysuria, hematuria, rash, seizure activity, orthopnea, PND, pedal edema, claudication. Remaining systems are negative.  Physical Exam: Well-developed well-nourished in no acute distress.  Skin is warm and dry.  HEENT is normal.  Neck is supple.  Chest is clear to auscultation with normal expansion.  Cardiovascular exam is regular rate and rhythm.  Abdominal exam nontender or distended. No masses palpated. Extremities show no edema. neuro grossly intact  ECG- personally reviewed  A/P  1 paroxysmal atrial fibrillation-patient remains in sinus rhythm.  Continue atenolol at present dose.  He also will continue flecainide as needed.  Can consider referral for ablation in the  future if he has more frequent episodes.  CHA2DS2-VASc 1 for hypertension.  Previously elected not to continue DOAC and instead is taking aspirin.  2 aortic atherosclerosis-continue statin.  3 hypertension-blood pressure controlled.  Continue present medical regimen.  Olga Millers, MD

## 2023-02-24 ENCOUNTER — Ambulatory Visit: Payer: BC Managed Care – PPO | Admitting: Cardiology

## 2023-04-29 ENCOUNTER — Other Ambulatory Visit: Payer: Self-pay | Admitting: Cardiology

## 2023-05-05 ENCOUNTER — Other Ambulatory Visit: Payer: Self-pay | Admitting: Cardiology

## 2023-05-05 DIAGNOSIS — I480 Paroxysmal atrial fibrillation: Secondary | ICD-10-CM

## 2023-05-24 NOTE — Progress Notes (Signed)
 HPI: FU atrial fibrillation. Patient had probable atrial fibrillation in January of 2013 for approximately 5 minutes. Recurrent episode in April 2013 requiring visit to ER. Patient was seen at [facility] for his atrial fibrillation. The patient was given a prescription for flecainide  to be used as needed. Did not tolerate toprol  previously; cardizem  changed to atenolol  at previous ov due to worse GERD; preferred ASA to DOAC. Echocardiogram March 2022 showed normal LV function.  CTA October 2023 showed aortic atherosclerosis.  Since I last saw him, patient denies dyspnea on exertion, orthopnea, PND, pedal edema, chest pain or syncope.  Occasional brief palpitations.  Current Outpatient Medications  Medication Sig Dispense Refill   aspirin  EC 81 MG tablet Take 1 tablet (81 mg total) by mouth daily. Swallow whole. 90 tablet 3   atenolol  (TENORMIN ) 25 MG tablet TAKE 1 TABLET BY MOUTH DAILY MAY TAKE EXTRA TABLET AS NEEDED FOR PALPITATIONS 30 tablet 0   omeprazole (PRILOSEC) 20 MG capsule Take 20 mg by mouth daily.     Pediatric Multivit-Minerals-C (MULTIVITAMINS PEDIATRIC PO) 1 tablet Orally daily     rosuvastatin  (CRESTOR ) 20 MG tablet TAKE ONE TABLET BY MOUTH ONCE DAILY 60 tablet 0   No current facility-administered medications for this visit.     Past Medical History:  Diagnosis Date   Allergy    Anxiety    Atrial fibrillation    Eosinophilic esophagitis    GERD (gastroesophageal reflux disease)    Hypertension    IBS (irritable bowel syndrome)    Migraines    Schatzki's ring     Past Surgical History:  Procedure Laterality Date   ADENOIDECTOMY     ARTHROSCOPIC REPAIR ACL     Left knee surgery     ACL   MYRINGOTOMY     SHOULDER ARTHROSCOPY Right 2021    Social History   Socioeconomic History   Marital status: Married    Spouse name: Not on file   Number of children: 2   Years of education: Not on file   Highest education level: Not on file  Occupational  History    Employer: [employer]    Comment: [employer]  Tobacco Use   Smoking status: Never   Smokeless tobacco: Never  Vaping Use   Vaping status: Never Used  Substance and Sexual Activity   Alcohol use: Yes    Alcohol/week: 2.0 standard drinks of alcohol    Types: 2 Glasses of wine per week    Comment: Glass of wine every other night   Drug use: No   Sexual activity: Yes    Partners: Female  Other Topics Concern   Not on file  Social History Narrative   Not on file   Social Drivers of Health   Financial Resource Strain: Not on file  Food Insecurity: Not on file  Transportation Needs: Not on file  Physical Activity: Not on file  Stress: Not on file (11/09/2022)  Social Connections: Not on file  Intimate Partner Violence: Low Risk  (04/19/2019)   Received from [facility]   Intimate Partner Violence    Insults You: Not on file    Threatens You: Not on file    Screams at You: Not on file    Physically Hurt: Not on file    Intimate Partner Violence Score: Not on file    Family History  Problem Relation Age of Onset   Anxiety disorder Mother    GER disease Mother  Hypertension Mother    Heart disease Father        Cardiomyopathy   GER disease Father    Colon cancer Neg Hx    Stomach cancer Neg Hx    Esophageal cancer Neg Hx    Rectal cancer Neg Hx    Colon polyps Neg Hx     ROS: no fevers or chills, productive cough, hemoptysis, dysphasia, odynophagia, melena, hematochezia, dysuria, hematuria, rash, seizure activity, orthopnea, PND, pedal edema, claudication. Remaining systems are negative.  Physical Exam: Well-developed well-nourished in no acute distress.  Skin is warm and dry.  HEENT is normal.  Neck is supple.  Chest is clear to auscultation with normal expansion.  Cardiovascular exam is regular rate and rhythm.  Abdominal exam nontender or distended. No masses palpated. Extremities show no edema. neuro grossly intact  EKG  Interpretation Date/Time:  Tuesday June 06 2023 15:27:58 EDT Ventricular Rate:  56 PR Interval:  154 QRS Duration:  96 QT Interval:  392 QTC Calculation: 378 R Axis:   23  Text Interpretation: Sinus bradycardia Confirmed by Alexandria Angel ([id number]) on 06/06/2023 3:31:44 PM    A/P  1 paroxysmal atrial fibrillation-patient remains in sinus rhythm.  Continue atenolol .  Continue flecainide  as needed.  CHA2DS2-VASc is 1 for hypertension.  He would prefer aspirin  over DOAC.  2 hypertension-patient's blood pressure is controlled.  Continue present medical regimen.  3 aortic atherosclerosis-continue statin.  Alexandria Angel, MD

## 2023-05-31 ENCOUNTER — Other Ambulatory Visit: Payer: Self-pay | Admitting: Cardiology

## 2023-05-31 DIAGNOSIS — I480 Paroxysmal atrial fibrillation: Secondary | ICD-10-CM

## 2023-06-06 ENCOUNTER — Ambulatory Visit: Payer: BC Managed Care – PPO | Attending: Cardiology | Admitting: Cardiology

## 2023-06-06 ENCOUNTER — Encounter: Payer: Self-pay | Admitting: Cardiology

## 2023-06-06 VITALS — BP 128/84 | HR 84 | Ht 70.0 in | Wt 178.4 lb

## 2023-06-06 DIAGNOSIS — I700 Atherosclerosis of aorta: Secondary | ICD-10-CM

## 2023-06-06 DIAGNOSIS — I10 Essential (primary) hypertension: Secondary | ICD-10-CM

## 2023-06-06 DIAGNOSIS — I480 Paroxysmal atrial fibrillation: Secondary | ICD-10-CM

## 2023-06-06 NOTE — Patient Instructions (Signed)

## 2023-07-01 ENCOUNTER — Other Ambulatory Visit: Payer: Self-pay | Admitting: Cardiology

## 2023-07-09 ENCOUNTER — Other Ambulatory Visit: Payer: Self-pay | Admitting: Cardiology

## 2023-07-09 DIAGNOSIS — I480 Paroxysmal atrial fibrillation: Secondary | ICD-10-CM

## 2023-11-02 ENCOUNTER — Encounter: Payer: Self-pay | Admitting: Cardiology

## 2023-11-02 ENCOUNTER — Other Ambulatory Visit: Payer: Self-pay

## 2023-11-02 MED ORDER — ROSUVASTATIN CALCIUM 20 MG PO TABS: 20.0000 mg | ORAL_TABLET | Freq: Every day | ORAL | 1 refills | Status: AC

## 2023-11-02 NOTE — Telephone Encounter (Signed)
 Refill sent to pharmacy.
